# Patient Record
Sex: Male | Born: 1995 | Race: White | Hispanic: No | Marital: Single | Smoking: Never smoker
Health system: Southern US, Community
[De-identification: ages and names within clinical notes are randomized; demographics above are authoritative.]

## PROBLEM LIST (undated history)

## (undated) ENCOUNTER — Emergency Department (HOSPITAL_COMMUNITY): Admission: EM | Payer: Self-pay | Source: Home / Self Care

## (undated) DIAGNOSIS — K8020 Calculus of gallbladder without cholecystitis without obstruction: Secondary | ICD-10-CM

## (undated) DIAGNOSIS — Z5189 Encounter for other specified aftercare: Secondary | ICD-10-CM

## (undated) HISTORY — DX: Encounter for other specified aftercare: Z51.89

## (undated) HISTORY — PX: COLOSTOMY: SHX63

---

## 2016-12-12 DIAGNOSIS — E559 Vitamin D deficiency, unspecified: Secondary | ICD-10-CM | POA: Diagnosis not present

## 2016-12-12 DIAGNOSIS — E1165 Type 2 diabetes mellitus with hyperglycemia: Secondary | ICD-10-CM | POA: Diagnosis not present

## 2016-12-12 DIAGNOSIS — I10 Essential (primary) hypertension: Secondary | ICD-10-CM | POA: Diagnosis not present

## 2016-12-12 DIAGNOSIS — Z130 Encounter for screening for diseases of the blood and blood-forming organs and certain disorders involving the immune mechanism: Secondary | ICD-10-CM | POA: Diagnosis not present

## 2016-12-12 DIAGNOSIS — E782 Mixed hyperlipidemia: Secondary | ICD-10-CM | POA: Diagnosis not present

## 2016-12-12 DIAGNOSIS — L600 Ingrowing nail: Secondary | ICD-10-CM | POA: Diagnosis not present

## 2016-12-12 DIAGNOSIS — E039 Hypothyroidism, unspecified: Secondary | ICD-10-CM | POA: Diagnosis not present

## 2016-12-12 DIAGNOSIS — R37 Sexual dysfunction, unspecified: Secondary | ICD-10-CM | POA: Diagnosis not present

## 2016-12-12 DIAGNOSIS — E790 Hyperuricemia without signs of inflammatory arthritis and tophaceous disease: Secondary | ICD-10-CM | POA: Diagnosis not present

## 2016-12-12 DIAGNOSIS — M79641 Pain in right hand: Secondary | ICD-10-CM | POA: Diagnosis not present

## 2017-01-18 DIAGNOSIS — N509 Disorder of male genital organs, unspecified: Secondary | ICD-10-CM | POA: Diagnosis not present

## 2017-01-18 DIAGNOSIS — F5232 Male orgasmic disorder: Secondary | ICD-10-CM | POA: Diagnosis not present

## 2017-05-10 DIAGNOSIS — Z3141 Encounter for fertility testing: Secondary | ICD-10-CM | POA: Diagnosis not present

## 2017-11-02 DIAGNOSIS — S60551A Superficial foreign body of right hand, initial encounter: Secondary | ICD-10-CM | POA: Diagnosis not present

## 2017-11-30 DIAGNOSIS — M25551 Pain in right hip: Secondary | ICD-10-CM | POA: Diagnosis not present

## 2017-11-30 DIAGNOSIS — Z6827 Body mass index (BMI) 27.0-27.9, adult: Secondary | ICD-10-CM | POA: Diagnosis not present

## 2017-11-30 DIAGNOSIS — Z1331 Encounter for screening for depression: Secondary | ICD-10-CM | POA: Diagnosis not present

## 2017-11-30 DIAGNOSIS — E663 Overweight: Secondary | ICD-10-CM | POA: Diagnosis not present

## 2018-01-19 DIAGNOSIS — H5203 Hypermetropia, bilateral: Secondary | ICD-10-CM | POA: Diagnosis not present

## 2018-02-28 ENCOUNTER — Inpatient Hospital Stay (HOSPITAL_COMMUNITY): Payer: BLUE CROSS/BLUE SHIELD

## 2018-02-28 ENCOUNTER — Encounter (HOSPITAL_COMMUNITY): Payer: Self-pay | Admitting: Certified Registered"

## 2018-02-28 ENCOUNTER — Emergency Department (HOSPITAL_COMMUNITY): Payer: BLUE CROSS/BLUE SHIELD

## 2018-02-28 ENCOUNTER — Inpatient Hospital Stay (HOSPITAL_COMMUNITY): Payer: BLUE CROSS/BLUE SHIELD | Admitting: Anesthesiology

## 2018-02-28 ENCOUNTER — Other Ambulatory Visit: Payer: Self-pay

## 2018-02-28 ENCOUNTER — Inpatient Hospital Stay (HOSPITAL_COMMUNITY)
Admission: EM | Admit: 2018-02-28 | Discharge: 2018-03-31 | DRG: 956 | Disposition: A | Discharge status: Home Health | Payer: BLUE CROSS/BLUE SHIELD | Attending: General Surgery | Admitting: General Surgery

## 2018-02-28 ENCOUNTER — Encounter (HOSPITAL_COMMUNITY): Admission: EM | Disposition: A | Discharge status: Home Health | Payer: Self-pay | Source: Home / Self Care

## 2018-02-28 DIAGNOSIS — K631 Perforation of intestine (nontraumatic): Secondary | ICD-10-CM

## 2018-02-28 DIAGNOSIS — S36899A Unspecified injury of other intra-abdominal organs, initial encounter: Secondary | ICD-10-CM | POA: Diagnosis present

## 2018-02-28 DIAGNOSIS — S72301A Unspecified fracture of shaft of right femur, initial encounter for closed fracture: Secondary | ICD-10-CM | POA: Diagnosis not present

## 2018-02-28 DIAGNOSIS — S36538A Laceration of other part of colon, initial encounter: Principal | ICD-10-CM | POA: Diagnosis present

## 2018-02-28 DIAGNOSIS — S36508A Unspecified injury of other part of colon, initial encounter: Secondary | ICD-10-CM

## 2018-02-28 DIAGNOSIS — S0081XA Abrasion of other part of head, initial encounter: Secondary | ICD-10-CM | POA: Diagnosis present

## 2018-02-28 DIAGNOSIS — N1330 Unspecified hydronephrosis: Secondary | ICD-10-CM | POA: Diagnosis not present

## 2018-02-28 DIAGNOSIS — Z0189 Encounter for other specified special examinations: Secondary | ICD-10-CM

## 2018-02-28 DIAGNOSIS — T148XXA Other injury of unspecified body region, initial encounter: Secondary | ICD-10-CM | POA: Diagnosis present

## 2018-02-28 DIAGNOSIS — R509 Fever, unspecified: Secondary | ICD-10-CM

## 2018-02-28 DIAGNOSIS — R739 Hyperglycemia, unspecified: Secondary | ICD-10-CM | POA: Diagnosis present

## 2018-02-28 DIAGNOSIS — J939 Pneumothorax, unspecified: Secondary | ICD-10-CM | POA: Diagnosis not present

## 2018-02-28 DIAGNOSIS — Z419 Encounter for procedure for purposes other than remedying health state, unspecified: Secondary | ICD-10-CM

## 2018-02-28 DIAGNOSIS — R188 Other ascites: Secondary | ICD-10-CM | POA: Diagnosis not present

## 2018-02-28 DIAGNOSIS — S9031XA Contusion of right foot, initial encounter: Secondary | ICD-10-CM | POA: Diagnosis present

## 2018-02-28 DIAGNOSIS — K56699 Other intestinal obstruction unspecified as to partial versus complete obstruction: Secondary | ICD-10-CM | POA: Diagnosis not present

## 2018-02-28 DIAGNOSIS — R0902 Hypoxemia: Secondary | ICD-10-CM | POA: Diagnosis not present

## 2018-02-28 DIAGNOSIS — S36409A Unspecified injury of unspecified part of small intestine, initial encounter: Secondary | ICD-10-CM | POA: Diagnosis present

## 2018-02-28 DIAGNOSIS — I959 Hypotension, unspecified: Secondary | ICD-10-CM | POA: Diagnosis present

## 2018-02-28 DIAGNOSIS — K55029 Acute infarction of small intestine, extent unspecified: Secondary | ICD-10-CM | POA: Diagnosis not present

## 2018-02-28 DIAGNOSIS — S72143D Displaced intertrochanteric fracture of unspecified femur, subsequent encounter for closed fracture with routine healing: Secondary | ICD-10-CM | POA: Diagnosis not present

## 2018-02-28 DIAGNOSIS — R402363 Coma scale, best motor response, obeys commands, at hospital admission: Secondary | ICD-10-CM | POA: Diagnosis present

## 2018-02-28 DIAGNOSIS — S36503A Unspecified injury of sigmoid colon, initial encounter: Secondary | ICD-10-CM | POA: Diagnosis present

## 2018-02-28 DIAGNOSIS — R000 Tachycardia, unspecified: Secondary | ICD-10-CM | POA: Diagnosis present

## 2018-02-28 DIAGNOSIS — S36598A Other injury of other part of colon, initial encounter: Secondary | ICD-10-CM | POA: Diagnosis present

## 2018-02-28 DIAGNOSIS — Z4682 Encounter for fitting and adjustment of non-vascular catheter: Secondary | ICD-10-CM | POA: Diagnosis not present

## 2018-02-28 DIAGNOSIS — J9600 Acute respiratory failure, unspecified whether with hypoxia or hypercapnia: Secondary | ICD-10-CM | POA: Diagnosis not present

## 2018-02-28 DIAGNOSIS — S36893A Laceration of other intra-abdominal organs, initial encounter: Secondary | ICD-10-CM

## 2018-02-28 DIAGNOSIS — S7291XA Unspecified fracture of right femur, initial encounter for closed fracture: Secondary | ICD-10-CM

## 2018-02-28 DIAGNOSIS — J9811 Atelectasis: Secondary | ICD-10-CM | POA: Diagnosis not present

## 2018-02-28 DIAGNOSIS — R404 Transient alteration of awareness: Secondary | ICD-10-CM | POA: Diagnosis not present

## 2018-02-28 DIAGNOSIS — S728X1A Other fracture of right femur, initial encounter for closed fracture: Secondary | ICD-10-CM | POA: Diagnosis not present

## 2018-02-28 DIAGNOSIS — S3719XA Other injury of ureter, initial encounter: Secondary | ICD-10-CM | POA: Diagnosis not present

## 2018-02-28 DIAGNOSIS — S37011A Minor contusion of right kidney, initial encounter: Secondary | ICD-10-CM | POA: Diagnosis present

## 2018-02-28 DIAGNOSIS — R52 Pain, unspecified: Secondary | ICD-10-CM | POA: Diagnosis not present

## 2018-02-28 DIAGNOSIS — S3993XA Unspecified injury of pelvis, initial encounter: Secondary | ICD-10-CM | POA: Diagnosis not present

## 2018-02-28 DIAGNOSIS — Z933 Colostomy status: Secondary | ICD-10-CM

## 2018-02-28 DIAGNOSIS — R0689 Other abnormalities of breathing: Secondary | ICD-10-CM | POA: Diagnosis not present

## 2018-02-28 DIAGNOSIS — B952 Enterococcus as the cause of diseases classified elsewhere: Secondary | ICD-10-CM | POA: Diagnosis present

## 2018-02-28 DIAGNOSIS — K388 Other specified diseases of appendix: Secondary | ICD-10-CM | POA: Diagnosis not present

## 2018-02-28 DIAGNOSIS — S199XXA Unspecified injury of neck, initial encounter: Secondary | ICD-10-CM | POA: Diagnosis not present

## 2018-02-28 DIAGNOSIS — K56609 Unspecified intestinal obstruction, unspecified as to partial versus complete obstruction: Secondary | ICD-10-CM | POA: Diagnosis not present

## 2018-02-28 DIAGNOSIS — S3710XA Unspecified injury of ureter, initial encounter: Secondary | ICD-10-CM | POA: Diagnosis not present

## 2018-02-28 DIAGNOSIS — E875 Hyperkalemia: Secondary | ICD-10-CM | POA: Diagnosis not present

## 2018-02-28 DIAGNOSIS — S3991XA Unspecified injury of abdomen, initial encounter: Secondary | ICD-10-CM | POA: Diagnosis not present

## 2018-02-28 DIAGNOSIS — K658 Other peritonitis: Secondary | ICD-10-CM | POA: Diagnosis not present

## 2018-02-28 DIAGNOSIS — Y92410 Unspecified street and highway as the place of occurrence of the external cause: Secondary | ICD-10-CM | POA: Diagnosis not present

## 2018-02-28 DIAGNOSIS — S7290XA Unspecified fracture of unspecified femur, initial encounter for closed fracture: Secondary | ICD-10-CM

## 2018-02-28 DIAGNOSIS — K651 Peritoneal abscess: Secondary | ICD-10-CM | POA: Diagnosis not present

## 2018-02-28 DIAGNOSIS — M79671 Pain in right foot: Secondary | ICD-10-CM

## 2018-02-28 DIAGNOSIS — K565 Intestinal adhesions [bands], unspecified as to partial versus complete obstruction: Secondary | ICD-10-CM | POA: Diagnosis present

## 2018-02-28 DIAGNOSIS — S36590A Other injury of ascending [right] colon, initial encounter: Secondary | ICD-10-CM | POA: Diagnosis not present

## 2018-02-28 DIAGNOSIS — S41111A Laceration without foreign body of right upper arm, initial encounter: Secondary | ICD-10-CM | POA: Diagnosis present

## 2018-02-28 DIAGNOSIS — T503X5A Adverse effect of electrolytic, caloric and water-balance agents, initial encounter: Secondary | ICD-10-CM | POA: Diagnosis not present

## 2018-02-28 DIAGNOSIS — K567 Ileus, unspecified: Secondary | ICD-10-CM | POA: Diagnosis not present

## 2018-02-28 DIAGNOSIS — S72391A Other fracture of shaft of right femur, initial encounter for closed fracture: Secondary | ICD-10-CM | POA: Diagnosis not present

## 2018-02-28 DIAGNOSIS — S36498A Other injury of other part of small intestine, initial encounter: Secondary | ICD-10-CM | POA: Diagnosis not present

## 2018-02-28 DIAGNOSIS — R402143 Coma scale, eyes open, spontaneous, at hospital admission: Secondary | ICD-10-CM | POA: Diagnosis present

## 2018-02-28 DIAGNOSIS — E46 Unspecified protein-calorie malnutrition: Secondary | ICD-10-CM | POA: Diagnosis not present

## 2018-02-28 DIAGNOSIS — S0181XA Laceration without foreign body of other part of head, initial encounter: Secondary | ICD-10-CM | POA: Diagnosis not present

## 2018-02-28 DIAGNOSIS — T07XXXA Unspecified multiple injuries, initial encounter: Secondary | ICD-10-CM | POA: Diagnosis not present

## 2018-02-28 DIAGNOSIS — T1490XA Injury, unspecified, initial encounter: Secondary | ICD-10-CM

## 2018-02-28 DIAGNOSIS — R079 Chest pain, unspecified: Secondary | ICD-10-CM | POA: Diagnosis not present

## 2018-02-28 DIAGNOSIS — E876 Hypokalemia: Secondary | ICD-10-CM | POA: Diagnosis not present

## 2018-02-28 DIAGNOSIS — S72351A Displaced comminuted fracture of shaft of right femur, initial encounter for closed fracture: Secondary | ICD-10-CM | POA: Diagnosis not present

## 2018-02-28 DIAGNOSIS — D62 Acute posthemorrhagic anemia: Secondary | ICD-10-CM | POA: Diagnosis not present

## 2018-02-28 DIAGNOSIS — N281 Cyst of kidney, acquired: Secondary | ICD-10-CM | POA: Diagnosis not present

## 2018-02-28 DIAGNOSIS — K659 Peritonitis, unspecified: Secondary | ICD-10-CM | POA: Diagnosis not present

## 2018-02-28 DIAGNOSIS — S2242XA Multiple fractures of ribs, left side, initial encounter for closed fracture: Secondary | ICD-10-CM | POA: Diagnosis not present

## 2018-02-28 DIAGNOSIS — Z978 Presence of other specified devices: Secondary | ICD-10-CM

## 2018-02-28 DIAGNOSIS — Z9889 Other specified postprocedural states: Secondary | ICD-10-CM

## 2018-02-28 DIAGNOSIS — R402253 Coma scale, best verbal response, oriented, at hospital admission: Secondary | ICD-10-CM | POA: Diagnosis present

## 2018-02-28 DIAGNOSIS — S0990XA Unspecified injury of head, initial encounter: Secondary | ICD-10-CM | POA: Diagnosis not present

## 2018-02-28 DIAGNOSIS — T8143XA Infection following a procedure, organ and space surgical site, initial encounter: Secondary | ICD-10-CM | POA: Diagnosis not present

## 2018-02-28 DIAGNOSIS — R5381 Other malaise: Secondary | ICD-10-CM | POA: Diagnosis not present

## 2018-02-28 DIAGNOSIS — R7989 Other specified abnormal findings of blood chemistry: Secondary | ICD-10-CM

## 2018-02-28 DIAGNOSIS — S299XXA Unspecified injury of thorax, initial encounter: Secondary | ICD-10-CM | POA: Diagnosis not present

## 2018-02-28 DIAGNOSIS — R7981 Abnormal blood-gas level: Secondary | ICD-10-CM

## 2018-02-28 HISTORY — PX: APPLICATION OF WOUND VAC: SHX5189

## 2018-02-28 HISTORY — PX: BOWEL RESECTION: SHX1257

## 2018-02-28 HISTORY — PX: LAPAROTOMY: SHX154

## 2018-02-28 HISTORY — PX: EXTERNAL FIXATION LEG: SHX1549

## 2018-02-28 LAB — I-STAT CG4 LACTIC ACID, ED: Lactic Acid, Venous: 3.4 mmol/L (ref 0.5–1.9)

## 2018-02-28 LAB — PREPARE FRESH FROZEN PLASMA
UNIT DIVISION: 0
UNIT DIVISION: 0
Unit division: 0

## 2018-02-28 LAB — BPAM FFP
BLOOD PRODUCT EXPIRATION DATE: 201910312359
BLOOD PRODUCT EXPIRATION DATE: 201910312359
Blood Product Expiration Date: 201910312359
Blood Product Expiration Date: 201910312359
ISSUE DATE / TIME: 201910300826
ISSUE DATE / TIME: 201910301922
ISSUE DATE / TIME: 201910300826
ISSUE DATE / TIME: 201910301922
UNIT TYPE AND RH: 6200
UNIT TYPE AND RH: 6200
UNIT TYPE AND RH: 6200
Unit Type and Rh: 6200

## 2018-02-28 LAB — I-STAT CHEM 8, ED
BUN: 20 mg/dL (ref 6–20)
CHLORIDE: 106 mmol/L (ref 98–111)
CREATININE: 1.3 mg/dL — AB (ref 0.61–1.24)
Calcium, Ion: 1.02 mmol/L — ABNORMAL LOW (ref 1.15–1.40)
GLUCOSE: 147 mg/dL — AB (ref 70–99)
HCT: 40 % (ref 39.0–52.0)
Hemoglobin: 13.6 g/dL (ref 13.0–17.0)
POTASSIUM: 2.6 mmol/L — AB (ref 3.5–5.1)
Sodium: 143 mmol/L (ref 135–145)
TCO2: 23 mmol/L (ref 22–32)

## 2018-02-28 LAB — MRSA PCR SCREENING: MRSA by PCR: NEGATIVE

## 2018-02-28 LAB — CBC
HCT: 44.5 % (ref 39.0–52.0)
HCT: 39.2 % (ref 39.0–52.0)
HEMOGLOBIN: 12.5 g/dL — AB (ref 13.0–17.0)
Hemoglobin: 14.1 g/dL (ref 13.0–17.0)
MCH: 27.7 pg (ref 26.0–34.0)
MCH: 27.5 pg (ref 26.0–34.0)
MCHC: 31.7 g/dL (ref 30.0–36.0)
MCHC: 31.9 g/dL (ref 30.0–36.0)
MCV: 87.4 fL (ref 80.0–100.0)
MCV: 86.2 fL (ref 80.0–100.0)
NRBC: 0 % (ref 0.0–0.2)
PLATELETS: 378 10*3/uL (ref 150–400)
PLATELETS: 286 10*3/uL (ref 150–400)
RBC: 5.09 MIL/uL (ref 4.22–5.81)
RBC: 4.55 MIL/uL (ref 4.22–5.81)
RDW: 12.3 % (ref 11.5–15.5)
RDW: 12.5 % (ref 11.5–15.5)
WBC: 21.6 10*3/uL — ABNORMAL HIGH (ref 4.0–10.5)
WBC: 17.3 10*3/uL — AB (ref 4.0–10.5)
nRBC: 0 % (ref 0.0–0.2)

## 2018-02-28 LAB — COMPREHENSIVE METABOLIC PANEL
ALK PHOS: 54 U/L (ref 38–126)
ALT: 50 U/L — AB (ref 0–44)
AST: 64 U/L — ABNORMAL HIGH (ref 15–41)
Albumin: 3.2 g/dL — ABNORMAL LOW (ref 3.5–5.0)
Anion gap: 8 (ref 5–15)
BILIRUBIN TOTAL: 0.7 mg/dL (ref 0.3–1.2)
BUN: 18 mg/dL (ref 6–20)
CALCIUM: 7.7 mg/dL — AB (ref 8.9–10.3)
CO2: 23 mmol/L (ref 22–32)
CREATININE: 1.25 mg/dL — AB (ref 0.61–1.24)
Chloride: 111 mmol/L (ref 98–111)
GFR calc Af Amer: >60 mL/min (ref >60)
GFR calc non Af Amer: >60 mL/min (ref >60)
Glucose, Bld: 153 mg/dL — ABNORMAL HIGH (ref 70–99)
Potassium: 2.6 mmol/L — CL (ref 3.5–5.1)
Sodium: 142 mmol/L (ref 135–145)
TOTAL PROTEIN: 5.7 g/dL — AB (ref 6.5–8.1)

## 2018-02-28 LAB — CDS SEROLOGY

## 2018-02-28 LAB — PROTIME-INR
INR: 1.13
PROTHROMBIN TIME: 14.4 s (ref 11.4–15.2)

## 2018-02-28 LAB — ETHANOL

## 2018-02-28 LAB — ABO/RH: ABO/RH(D): A POS

## 2018-02-28 SURGERY — LAPAROTOMY, EXPLORATORY
Anesthesia: General | Site: Leg Upper | Laterality: Right

## 2018-02-28 MED ORDER — PHENYLEPHRINE 40 MCG/ML (10ML) SYRINGE FOR IV PUSH (FOR BLOOD PRESSURE SUPPORT)
PREFILLED_SYRINGE | INTRAVENOUS | Status: AC
  Filled 2018-02-28: qty 10

## 2018-02-28 MED ORDER — FENTANYL CITRATE (PF) 250 MCG/5ML IJ SOLN
INTRAMUSCULAR | Status: DC | PRN
  Administered 2018-02-28 (×4): 50 ug via INTRAVENOUS
  Administered 2018-02-28: 25 ug via INTRAVENOUS
  Administered 2018-02-28 (×2): 50 ug via INTRAVENOUS

## 2018-02-28 MED ORDER — CHLORHEXIDINE GLUCONATE 4 % EX LIQD
60.0000 mL | Freq: Once | CUTANEOUS | Status: DC
  Filled 2018-02-28: qty 60

## 2018-02-28 MED ORDER — LIDOCAINE 2% (20 MG/ML) 5 ML SYRINGE
INTRAMUSCULAR | Status: DC | PRN
  Administered 2018-02-28: 100 mg via INTRAVENOUS

## 2018-02-28 MED ORDER — ALBUMIN HUMAN 5 % IV SOLN
INTRAVENOUS | Status: DC | PRN
  Administered 2018-02-28 (×2): via INTRAVENOUS

## 2018-02-28 MED ORDER — SODIUM CHLORIDE 0.9 % IV BOLUS
500.0000 mL | Freq: Once | INTRAVENOUS | Status: AC
  Administered 2018-02-28: 500 mL via INTRAVENOUS

## 2018-02-28 MED ORDER — SUGAMMADEX SODIUM 200 MG/2ML IV SOLN
INTRAVENOUS | Status: DC | PRN
  Administered 2018-02-28: 200 mg via INTRAVENOUS

## 2018-02-28 MED ORDER — LIDOCAINE 2% (20 MG/ML) 5 ML SYRINGE
INTRAMUSCULAR | Status: AC
  Filled 2018-02-28: qty 5

## 2018-02-28 MED ORDER — FENTANYL CITRATE (PF) 100 MCG/2ML IJ SOLN
100.0000 ug | INTRAMUSCULAR | Status: DC | PRN
  Administered 2018-02-28: 100 ug via INTRAVENOUS
  Filled 2018-02-28: qty 2

## 2018-02-28 MED ORDER — METOPROLOL TARTRATE 5 MG/5ML IV SOLN: 5.0000 mg | Freq: Four times a day (QID) | INTRAVENOUS | Status: DC | PRN

## 2018-02-28 MED ORDER — MIDAZOLAM HCL 2 MG/2ML IJ SOLN
INTRAMUSCULAR | Status: AC
  Filled 2018-02-28: qty 2

## 2018-02-28 MED ORDER — METHOCARBAMOL 1000 MG/10ML IJ SOLN
500.0000 mg | Freq: Three times a day (TID) | INTRAVENOUS | Status: DC
  Administered 2018-02-28 – 2018-03-05 (×15): 500 mg via INTRAVENOUS
  Filled 2018-02-28 (×16): qty 5

## 2018-02-28 MED ORDER — MIDAZOLAM HCL 5 MG/5ML IJ SOLN
INTRAMUSCULAR | Status: DC | PRN
  Administered 2018-02-28: 2 mg via INTRAVENOUS

## 2018-02-28 MED ORDER — ALBUMIN HUMAN 5 % IV SOLN
12.5000 g | Freq: Once | INTRAVENOUS | Status: AC
  Administered 2018-02-28: 12.5 g via INTRAVENOUS
  Filled 2018-02-28: qty 500

## 2018-02-28 MED ORDER — HYDROMORPHONE 1 MG/ML IV SOLN
INTRAVENOUS | Status: DC
  Administered 2018-02-28: 17:00:00 via INTRAVENOUS
  Filled 2018-02-28 (×2): qty 25

## 2018-02-28 MED ORDER — ONDANSETRON HCL 4 MG/2ML IJ SOLN: 4.0000 mg | Freq: Four times a day (QID) | INTRAMUSCULAR | Status: DC | PRN

## 2018-02-28 MED ORDER — NALOXONE HCL 0.4 MG/ML IJ SOLN
INTRAMUSCULAR | Status: AC
  Administered 2018-02-28: 0.4 mg via INTRAVENOUS
  Filled 2018-02-28: qty 1

## 2018-02-28 MED ORDER — SODIUM CHLORIDE 0.9 % IV SOLN
INTRAVENOUS | Status: AC | PRN
  Administered 2018-02-28: 1000 mL via INTRAVENOUS

## 2018-02-28 MED ORDER — DEXAMETHASONE SODIUM PHOSPHATE 10 MG/ML IJ SOLN
INTRAMUSCULAR | Status: AC
  Filled 2018-02-28: qty 1

## 2018-02-28 MED ORDER — POVIDONE-IODINE 10 % EX SWAB: 2.0000 "application " | Freq: Once | CUTANEOUS | Status: DC

## 2018-02-28 MED ORDER — ONDANSETRON HCL 4 MG/2ML IJ SOLN
INTRAMUSCULAR | Status: DC | PRN
  Administered 2018-02-28: 4 mg via INTRAVENOUS

## 2018-02-28 MED ORDER — EPHEDRINE 5 MG/ML INJ
INTRAVENOUS | Status: AC
  Filled 2018-02-28: qty 10

## 2018-02-28 MED ORDER — CEFAZOLIN SODIUM-DEXTROSE 2-4 GM/100ML-% IV SOLN: 2.0000 g | INTRAVENOUS | Status: DC

## 2018-02-28 MED ORDER — FENTANYL CITRATE (PF) 250 MCG/5ML IJ SOLN
INTRAMUSCULAR | Status: AC
  Filled 2018-02-28: qty 5

## 2018-02-28 MED ORDER — FENTANYL CITRATE (PF) 100 MCG/2ML IJ SOLN
INTRAMUSCULAR | Status: AC | PRN
  Administered 2018-02-28: 100 ug via INTRAVENOUS

## 2018-02-28 MED ORDER — SODIUM CHLORIDE 0.9 % IV SOLN
2.0000 g | Freq: Two times a day (BID) | INTRAVENOUS | Status: DC
  Filled 2018-02-28 (×3): qty 2

## 2018-02-28 MED ORDER — GLYCOPYRROLATE PF 0.2 MG/ML IJ SOSY
PREFILLED_SYRINGE | INTRAMUSCULAR | Status: AC
  Filled 2018-02-28: qty 2

## 2018-02-28 MED ORDER — SODIUM CHLORIDE 0.9 % IV SOLN
2.0000 g | Freq: Two times a day (BID) | INTRAVENOUS | Status: DC
  Filled 2018-02-28: qty 2

## 2018-02-28 MED ORDER — FENTANYL CITRATE (PF) 100 MCG/2ML IJ SOLN
50.0000 ug | INTRAMUSCULAR | Status: DC | PRN
  Administered 2018-02-28: 100 ug via INTRAVENOUS
  Administered 2018-03-01 (×3): 50 ug via INTRAVENOUS
  Administered 2018-03-02 – 2018-03-04 (×2): 100 ug via INTRAVENOUS
  Administered 2018-03-04: 50 ug via INTRAVENOUS
  Administered 2018-03-04 – 2018-03-05 (×8): 100 ug via INTRAVENOUS
  Administered 2018-03-05: 50 ug via INTRAVENOUS
  Administered 2018-03-05 – 2018-03-07 (×12): 100 ug via INTRAVENOUS
  Filled 2018-02-28 (×29): qty 2

## 2018-02-28 MED ORDER — PHENYLEPHRINE 40 MCG/ML (10ML) SYRINGE FOR IV PUSH (FOR BLOOD PRESSURE SUPPORT)
PREFILLED_SYRINGE | INTRAVENOUS | Status: DC | PRN
  Administered 2018-02-28: 80 ug via INTRAVENOUS
  Administered 2018-02-28: 120 ug via INTRAVENOUS

## 2018-02-28 MED ORDER — FENTANYL CITRATE (PF) 100 MCG/2ML IJ SOLN: 25.0000 ug | INTRAMUSCULAR | Status: DC | PRN

## 2018-02-28 MED ORDER — POTASSIUM CHLORIDE 10 MEQ/100ML IV SOLN
10.0000 meq | INTRAVENOUS | Status: DC
  Administered 2018-02-28 (×3): 10 meq via INTRAVENOUS
  Filled 2018-02-28: qty 100

## 2018-02-28 MED ORDER — IOHEXOL 300 MG/ML  SOLN
100.0000 mL | Freq: Once | INTRAMUSCULAR | Status: AC
  Administered 2018-02-28: 100 mL via INTRAVENOUS

## 2018-02-28 MED ORDER — PROPOFOL 10 MG/ML IV BOLUS
INTRAVENOUS | Status: AC
  Filled 2018-02-28: qty 20

## 2018-02-28 MED ORDER — PROPOFOL 10 MG/ML IV BOLUS
INTRAVENOUS | Status: DC | PRN
  Administered 2018-02-28: 30 mg via INTRAVENOUS
  Administered 2018-02-28: 150 mg via INTRAVENOUS

## 2018-02-28 MED ORDER — ACETAMINOPHEN 10 MG/ML IV SOLN
1000.0000 mg | Freq: Four times a day (QID) | INTRAVENOUS | Status: AC
  Administered 2018-02-28 – 2018-03-01 (×3): 1000 mg via INTRAVENOUS
  Filled 2018-02-28 (×4): qty 100

## 2018-02-28 MED ORDER — LACTATED RINGERS IV SOLN: INTRAVENOUS | Status: DC

## 2018-02-28 MED ORDER — DEXMEDETOMIDINE HCL IN NACL 200 MCG/50ML IV SOLN
INTRAVENOUS | Status: DC | PRN
  Administered 2018-02-28 (×2): 8 ug via INTRAVENOUS

## 2018-02-28 MED ORDER — LACTATED RINGERS IV SOLN
INTRAVENOUS | Status: DC | PRN
  Administered 2018-02-28: 09:00:00 via INTRAVENOUS

## 2018-02-28 MED ORDER — ONDANSETRON HCL 4 MG/2ML IJ SOLN
4.0000 mg | Freq: Four times a day (QID) | INTRAMUSCULAR | Status: DC | PRN
  Administered 2018-03-05 – 2018-03-07 (×5): 4 mg via INTRAVENOUS
  Filled 2018-02-28 (×6): qty 2

## 2018-02-28 MED ORDER — FAMOTIDINE IN NACL 20-0.9 MG/50ML-% IV SOLN
20.0000 mg | INTRAVENOUS | Status: DC
  Administered 2018-03-01 – 2018-03-05 (×5): 20 mg via INTRAVENOUS
  Filled 2018-02-28 (×6): qty 50

## 2018-02-28 MED ORDER — SODIUM CHLORIDE 0.9 % IV SOLN
2.0000 g | Freq: Once | INTRAVENOUS | Status: AC
  Administered 2018-02-28: 09:00:00 via INTRAVENOUS
  Filled 2018-02-28: qty 2

## 2018-02-28 MED ORDER — ONDANSETRON HCL 4 MG/2ML IJ SOLN
INTRAMUSCULAR | Status: AC
  Filled 2018-02-28: qty 2

## 2018-02-28 MED ORDER — DEXAMETHASONE SODIUM PHOSPHATE 10 MG/ML IJ SOLN
INTRAMUSCULAR | Status: DC | PRN
  Administered 2018-02-28: 10 mg via INTRAVENOUS

## 2018-02-28 MED ORDER — ALBUMIN HUMAN 5 % IV SOLN
12.5000 g | Freq: Once | INTRAVENOUS | Status: AC
  Administered 2018-02-28: 12.5 g via INTRAVENOUS

## 2018-02-28 MED ORDER — DIPHENHYDRAMINE HCL 50 MG/ML IJ SOLN: 12.5000 mg | Freq: Four times a day (QID) | INTRAMUSCULAR | Status: DC | PRN

## 2018-02-28 MED ORDER — DIPHENHYDRAMINE HCL 12.5 MG/5ML PO ELIX: 12.5000 mg | ORAL_SOLUTION | Freq: Four times a day (QID) | ORAL | Status: DC | PRN

## 2018-02-28 MED ORDER — CEFOTETAN DISODIUM-DEXTROSE 2-2.08 GM-%(50ML) IV SOLR
2.0000 g | INTRAVENOUS | Status: DC
  Filled 2018-02-28: qty 50

## 2018-02-28 MED ORDER — CEFOTETAN DISODIUM-DEXTROSE 2-2.08 GM-%(50ML) IV SOLR
2.0000 g | Freq: Two times a day (BID) | INTRAVENOUS | Status: AC
  Administered 2018-02-28 – 2018-03-03 (×5): 2 g via INTRAVENOUS
  Filled 2018-02-28 (×10): qty 50

## 2018-02-28 MED ORDER — SODIUM CHLORIDE 0.9% FLUSH: 9.0000 mL | INTRAVENOUS | Status: DC | PRN

## 2018-02-28 MED ORDER — ROCURONIUM BROMIDE 10 MG/ML (PF) SYRINGE
PREFILLED_SYRINGE | INTRAVENOUS | Status: DC | PRN
  Administered 2018-02-28: 20 mg via INTRAVENOUS
  Administered 2018-02-28: 50 mg via INTRAVENOUS
  Administered 2018-02-28: 20 mg via INTRAVENOUS
  Administered 2018-02-28: 10 mg via INTRAVENOUS
  Administered 2018-02-28: 20 mg via INTRAVENOUS

## 2018-02-28 MED ORDER — PROPOFOL 1000 MG/100ML IV EMUL
INTRAVENOUS | Status: AC
  Filled 2018-02-28: qty 100

## 2018-02-28 MED ORDER — PANTOPRAZOLE SODIUM 40 MG PO TBEC
40.0000 mg | DELAYED_RELEASE_TABLET | ORAL | Status: DC
  Administered 2018-03-06 – 2018-03-07 (×2): 40 mg via ORAL
  Filled 2018-02-28 (×2): qty 1

## 2018-02-28 MED ORDER — ROCURONIUM BROMIDE 50 MG/5ML IV SOSY
PREFILLED_SYRINGE | INTRAVENOUS | Status: AC
  Filled 2018-02-28: qty 10

## 2018-02-28 MED ORDER — NALOXONE HCL 0.4 MG/ML IJ SOLN
0.4000 mg | INTRAMUSCULAR | Status: DC | PRN
  Administered 2018-02-28: 0.4 mg via INTRAVENOUS

## 2018-02-28 MED ORDER — SUCCINYLCHOLINE CHLORIDE 200 MG/10ML IV SOSY
PREFILLED_SYRINGE | INTRAVENOUS | Status: DC | PRN
  Administered 2018-02-28: 100 mg via INTRAVENOUS

## 2018-02-28 MED ORDER — KETOROLAC TROMETHAMINE 30 MG/ML IJ SOLN
INTRAMUSCULAR | Status: DC | PRN
  Administered 2018-02-28: 30 mg via INTRAVENOUS

## 2018-02-28 MED ORDER — ONDANSETRON 4 MG PO TBDP
4.0000 mg | ORAL_TABLET | Freq: Four times a day (QID) | ORAL | Status: DC | PRN
  Filled 2018-02-28: qty 1

## 2018-02-28 MED ORDER — SUCCINYLCHOLINE CHLORIDE 200 MG/10ML IV SOSY
PREFILLED_SYRINGE | INTRAVENOUS | Status: AC
  Filled 2018-02-28: qty 10

## 2018-02-28 MED ORDER — FENTANYL CITRATE (PF) 100 MCG/2ML IJ SOLN
INTRAMUSCULAR | Status: AC
  Filled 2018-02-28: qty 2

## 2018-02-28 MED ORDER — FENTANYL CITRATE (PF) 100 MCG/2ML IJ SOLN
50.0000 ug | INTRAMUSCULAR | Status: DC | PRN
  Administered 2018-02-28 (×2): 50 ug via INTRAVENOUS
  Filled 2018-02-28 (×2): qty 2

## 2018-02-28 MED ORDER — METOCLOPRAMIDE HCL 5 MG/ML IJ SOLN: 10.0000 mg | Freq: Once | INTRAMUSCULAR | Status: DC | PRN

## 2018-02-28 MED ORDER — INFLUENZA VAC SPLIT QUAD 0.5 ML IM SUSY: 0.5000 mL | PREFILLED_SYRINGE | INTRAMUSCULAR | Status: DC

## 2018-02-28 MED ORDER — KCL IN DEXTROSE-NACL 40-5-0.45 MEQ/L-%-% IV SOLN
INTRAVENOUS | Status: DC
  Administered 2018-02-28 – 2018-03-05 (×9): via INTRAVENOUS
  Filled 2018-02-28 (×13): qty 1000

## 2018-02-28 MED ORDER — 0.9 % SODIUM CHLORIDE (POUR BTL) OPTIME
TOPICAL | Status: DC | PRN
  Administered 2018-02-28 (×5): 1000 mL

## 2018-02-28 MED ORDER — ROCURONIUM BROMIDE 50 MG/5ML IV SOSY
PREFILLED_SYRINGE | INTRAVENOUS | Status: AC
  Filled 2018-02-28: qty 5

## 2018-02-28 MED ORDER — SODIUM CHLORIDE 0.9 % IV SOLN
INTRAVENOUS | Status: DC | PRN
  Administered 2018-02-28: 15 ug/min via INTRAVENOUS

## 2018-02-28 MED ORDER — MEPERIDINE HCL 50 MG/ML IJ SOLN: 6.2500 mg | INTRAMUSCULAR | Status: DC | PRN

## 2018-02-28 MED ORDER — POTASSIUM CHLORIDE 10 MEQ/100ML IV SOLN
10.0000 meq | INTRAVENOUS | Status: DC
  Filled 2018-02-28 (×3): qty 100

## 2018-02-28 SURGICAL SUPPLY — 93 items
APPLIER CLIP 5 13 M/L LIGAMAX5 (MISCELLANEOUS)
BANDAGE ACE 4X5 VEL STRL LF (GAUZE/BANDAGES/DRESSINGS) ×5 IMPLANT
BANDAGE ACE 6X5 VEL STRL LF (GAUZE/BANDAGES/DRESSINGS) ×5 IMPLANT
BAR GLASS FIBER EXFX 11X300 (EXFIX) ×10 IMPLANT
BIT DRILL ORANGE 4.0 LONG (BIT) ×5 IMPLANT
BLADE CLIPPER SURG (BLADE) IMPLANT
BNDG COHESIVE 4X5 TAN STRL (GAUZE/BANDAGES/DRESSINGS) ×5 IMPLANT
BNDG COHESIVE 6X5 TAN STRL LF (GAUZE/BANDAGES/DRESSINGS) ×5 IMPLANT
BNDG GAUZE ELAST 4 BULKY (GAUZE/BANDAGES/DRESSINGS) ×5 IMPLANT
BRUSH SCRUB SURG 4.25 DISP (MISCELLANEOUS) ×10 IMPLANT
CANISTER SUCT 3000ML PPV (MISCELLANEOUS) ×10 IMPLANT
CHLORAPREP W/TINT 26ML (MISCELLANEOUS) ×20 IMPLANT
CLIP APPLIE 5 13 M/L LIGAMAX5 (MISCELLANEOUS) IMPLANT
COVER MAYO STAND STRL (DRAPES) ×5 IMPLANT
COVER SURGICAL LIGHT HANDLE (MISCELLANEOUS) ×25 IMPLANT
COVER WAND RF STERILE (DRAPES) ×10 IMPLANT
DECANTER SPIKE VIAL GLASS SM (MISCELLANEOUS) ×10 IMPLANT
DERMABOND ADVANCED (GAUZE/BANDAGES/DRESSINGS) ×1
DERMABOND ADVANCED .7 DNX12 (GAUZE/BANDAGES/DRESSINGS) ×4 IMPLANT
DRAPE C-ARM 42X72 X-RAY (DRAPES) IMPLANT
DRAPE C-ARMOR (DRAPES) ×5 IMPLANT
DRAPE IMP U-DRAPE 54X76 (DRAPES) ×20 IMPLANT
DRAPE LAPAROSCOPIC ABDOMINAL (DRAPES) ×5 IMPLANT
DRAPE ORTHO SPLIT 77X108 STRL (DRAPES) ×4
DRAPE SURG ORHT 6 SPLT 77X108 (DRAPES) ×16 IMPLANT
DRAPE U-SHAPE 47X51 STRL (DRAPES) ×5 IMPLANT
DRAPE WARM FLUID 44X44 (DRAPE) ×10 IMPLANT
DRSG MEPITEL 4X7.2 (GAUZE/BANDAGES/DRESSINGS) IMPLANT
DRSG OPSITE POSTOP 4X10 (GAUZE/BANDAGES/DRESSINGS) IMPLANT
DRSG OPSITE POSTOP 4X8 (GAUZE/BANDAGES/DRESSINGS) IMPLANT
DRSG TEGADERM 4X4.75 (GAUZE/BANDAGES/DRESSINGS) ×5 IMPLANT
DRSG VAC ATS MED SENSATRAC (GAUZE/BANDAGES/DRESSINGS) ×5 IMPLANT
ELECT BLADE 6.5 EXT (BLADE) ×5 IMPLANT
ELECT CAUTERY BLADE 6.4 (BLADE) ×10 IMPLANT
ELECT REM PT RETURN 9FT ADLT (ELECTROSURGICAL) ×20
ELECTRODE REM PT RTRN 9FT ADLT (ELECTROSURGICAL) ×16 IMPLANT
GAUZE SPONGE 4X4 12PLY STRL (GAUZE/BANDAGES/DRESSINGS) ×10 IMPLANT
GLOVE BIO SURGEON STRL SZ7.5 (GLOVE) ×45 IMPLANT
GLOVE BIOGEL PI IND STRL 7.5 (GLOVE) ×12 IMPLANT
GLOVE BIOGEL PI IND STRL 8 (GLOVE) ×8 IMPLANT
GLOVE BIOGEL PI INDICATOR 7.5 (GLOVE) ×3
GLOVE BIOGEL PI INDICATOR 8 (GLOVE) ×2
GLOVE ECLIPSE 7.5 STRL STRAW (GLOVE) ×10 IMPLANT
GLOVE SURG SS PI 6.5 STRL IVOR (GLOVE) ×5 IMPLANT
GOWN STRL REUS W/ TWL LRG LVL3 (GOWN DISPOSABLE) ×32 IMPLANT
GOWN STRL REUS W/TWL LRG LVL3 (GOWN DISPOSABLE) ×8
KIT BASIN OR (CUSTOM PROCEDURE TRAY) ×20 IMPLANT
KIT OSTOMY DRAINABLE 2.75 STR (WOUND CARE) ×5 IMPLANT
KIT TURNOVER KIT B (KITS) ×20 IMPLANT
LIGASURE IMPACT 36 18CM CVD LR (INSTRUMENTS) ×5 IMPLANT
MANIFOLD NEPTUNE II (INSTRUMENTS) ×10 IMPLANT
NEEDLE 22X1 1/2 (OR ONLY) (NEEDLE) IMPLANT
NS IRRIG 1000ML POUR BTL (IV SOLUTION) ×25 IMPLANT
PACK GENERAL/GYN (CUSTOM PROCEDURE TRAY) ×10 IMPLANT
PACK ORTHO EXTREMITY (CUSTOM PROCEDURE TRAY) ×5 IMPLANT
PAD ARMBOARD 7.5X6 YLW CONV (MISCELLANEOUS) ×35 IMPLANT
PADDING CAST COTTON 6X4 STRL (CAST SUPPLIES) IMPLANT
PENCIL BUTTON HOLSTER BLD 10FT (ELECTRODE) ×5 IMPLANT
PIN CLAMP 2BAR 75MM BLUE (EXFIX) ×10 IMPLANT
PIN XTRAFIX LG BLUNT 5X200X45 (PIN) ×20 IMPLANT
RELOAD PROXIMATE 75MM BLUE (ENDOMECHANICALS) ×10 IMPLANT
RELOAD PROXIMATE TA60MM BLUE (ENDOMECHANICALS) ×5 IMPLANT
SCISSORS LAP 5X35 DISP (ENDOMECHANICALS) IMPLANT
SEPRAFILM PROCEDURAL PACK 3X5 (MISCELLANEOUS) IMPLANT
SET IRRIG TUBING LAPAROSCOPIC (IRRIGATION / IRRIGATOR) IMPLANT
SLEEVE ENDOPATH XCEL 5M (ENDOMECHANICALS) ×5 IMPLANT
SPECIMEN JAR LARGE (MISCELLANEOUS) IMPLANT
SPONGE LAP 18X18 X RAY DECT (DISPOSABLE) ×15 IMPLANT
STAPLER GUN LINEAR PROX 60 (STAPLE) ×5 IMPLANT
STAPLER PROXIMATE 75MM BLUE (STAPLE) ×5 IMPLANT
STAPLER VISISTAT 35W (STAPLE) ×20 IMPLANT
STRIP CLOSURE SKIN 1/2X4 (GAUZE/BANDAGES/DRESSINGS) IMPLANT
SUCTION POOLE TIP (SUCTIONS) ×10 IMPLANT
SUT MNCRL AB 4-0 PS2 18 (SUTURE) ×5 IMPLANT
SUT NOVA 1 T20/GS 25DT (SUTURE) IMPLANT
SUT PDS AB 1 TP1 96 (SUTURE) ×10 IMPLANT
SUT PROLENE 0 CT (SUTURE) IMPLANT
SUT PROLENE 2 0 SH 30 (SUTURE) ×5 IMPLANT
SUT SILK 2 0 SH CR/8 (SUTURE) ×15 IMPLANT
SUT SILK 2 0 TIES 10X30 (SUTURE) ×15 IMPLANT
SUT SILK 3 0 SH CR/8 (SUTURE) ×10 IMPLANT
SUT SILK 3 0 TIES 10X30 (SUTURE) ×10 IMPLANT
TOWEL OR 17X24 6PK STRL BLUE (TOWEL DISPOSABLE) ×25 IMPLANT
TOWEL OR 17X26 10 PK STRL BLUE (TOWEL DISPOSABLE) ×30 IMPLANT
TRAY FOLEY MTR SLVR 16FR STAT (SET/KITS/TRAYS/PACK) ×5 IMPLANT
TRAY LAPAROSCOPIC MC (CUSTOM PROCEDURE TRAY) ×5 IMPLANT
TROCAR XCEL BLUNT TIP 100MML (ENDOMECHANICALS) ×5 IMPLANT
TROCAR XCEL NON-BLD 11X100MML (ENDOMECHANICALS) IMPLANT
TROCAR XCEL NON-BLD 5MMX100MML (ENDOMECHANICALS) ×5 IMPLANT
TUBING INSUFFLATION (TUBING) ×5 IMPLANT
UNDERPAD 30X30 (UNDERPADS AND DIAPERS) ×10 IMPLANT
WND VAC CANISTER 500ML (MISCELLANEOUS) ×5 IMPLANT
YANKAUER SUCT BULB TIP NO VENT (SUCTIONS) ×10 IMPLANT
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
## 2018-02-28 NOTE — Progress Notes (Signed)
Orthopedic Tech Progress Note Patient Details:  Richard E Fenech Jr. [birth year] [id number]  Musculoskeletal Traction Type of Traction: Gilberto Better Traction Traction Location: rle   Post Interventions Patient Tolerated: Well Instructions Provided: Care of device, Adjustment of device Applied at drs request with drs assistance.  Trinna Post 02/28/2018, 7:38 AM

## 2018-02-28 NOTE — ED Notes (Signed)
Christy, RN called from OR to request patient be brought up at this time

## 2018-02-28 NOTE — Consult Note (Addendum)
WOC consult requested for new ileostomy which was performed today and Vac dressing to abd.  Pt is currently in the OR; WOC team will assess ostomy pouching situation tomorrow and plan to perform first post-op dressing change with PA at the bedside to assess wound appearance on Friday. Cammie Mcgee MSN, RN, CWOCN, [place], CNS [phone]
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
## 2018-02-28 NOTE — ED Notes (Signed)
Patient transported to CT by Lowella Bandy, [employer] fund[employer]

## 2018-02-28 NOTE — Anesthesia Procedure Notes (Addendum)
Procedure Name: Intubation Date/Time: 02/28/2018 8:52 AM Performed by: Elliot Dally, CRNA Pre-anesthesia Checklist: Patient identified, Emergency Drugs available, Suction available and Patient being monitored Patient Re-evaluated:Patient Re-evaluated prior to induction Oxygen Delivery Method: Circle System Utilized Preoxygenation: Pre-oxygenation with 100% oxygen Induction Type: IV induction, Rapid sequence and Cricoid Pressure applied Laryngoscope Size: Glidescope and 3 Grade View: Grade I Tube type: Oral Tube size: 7.5 mm Number of attempts: 1 Airway Equipment and Method: Stylet and Oral airway Placement Confirmation: ETT inserted through vocal cords under direct vision,  positive ETCO2 and breath sounds checked- equal and bilateral Secured at: 22 cm Tube secured with: Tape Dental Injury: Teeth and Oropharynx as per pre-operative assessment  Comments: Elective glide scope to maintain C-spine
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
## 2018-02-28 NOTE — ED Notes (Signed)
ISTAT CHEM 8& ISTAT LACTIC WAS SHOWN TO DR.WYATT.NURSE IS AWEAR  ASWELL,
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
## 2018-02-28 NOTE — ED Notes (Signed)
Family at bedside. 

## 2018-02-28 NOTE — Progress Notes (Signed)
Spoke with Dr. Lindie Spruce regarding pt tachycardia mid to upper 140's. Informed him pt stated PCA not effective indicating he was not going to use it. Also, nursing noted pt to be sleeping continuously, easily awakens to voice but does not hold conversation as he is quite drowsy. New orders received, will continue to monitor.

## 2018-02-28 NOTE — Anesthesia Postprocedure Evaluation (Signed)
Anesthesia Post Note  Patient: Richard E Schwanz Jr.  Procedure(s) Performed: EXPLORATORY LAPAROTOMY (N/A Abdomen) SMALL BOWEL RESECTION (N/A Abdomen) APPLICATION OF WOUND VAC (N/A Abdomen) EXTERNAL FIXATION RIGHT LEG (Right Leg Upper)     Patient location during evaluation: PACU Anesthesia Type: General Level of consciousness: awake and alert Pain management: pain level controlled Vital Signs Assessment: post-procedure vital signs reviewed and stable Respiratory status: spontaneous breathing, nonlabored ventilation, respiratory function stable and patient connected to nasal cannula oxygen Cardiovascular status: blood pressure returned to baseline and stable Postop Assessment: no apparent nausea or vomiting Anesthetic complications: no    Last Vitals:  Vitals:   02/28/18 1406 02/28/18 1500  BP: (!) 132/93 (!) 144/83  Pulse:  (!) 102  Resp: (!) 27 (!) 24  Temp:    SpO2:  100%    Last Pain:  Vitals:   02/28/18 1550  TempSrc:   PainSc: Asleep                 Phillips Grout
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
## 2018-02-28 NOTE — ED Notes (Signed)
Ortho at bedside to set up traction

## 2018-02-28 NOTE — Transfer of Care (Signed)
Immediate Anesthesia Transfer of Care Note  Patient: Richard E Hoard Jr.  Procedure(s) Performed: EXPLORATORY LAPAROTOMY (N/A Abdomen) SMALL BOWEL RESECTION (N/A Abdomen) APPLICATION OF WOUND VAC (N/A Abdomen) EXTERNAL FIXATION RIGHT LEG (Right Leg Upper)  Patient Location: PACU  Anesthesia Type:General  Level of Consciousness: drowsy  Airway & Oxygen Therapy: Patient Spontanous Breathing and Patient connected to nasal cannula oxygen  Post-op Assessment: Report given to RN and Post -op Vital signs reviewed and stable  Post vital signs: Reviewed and stable  Last Vitals:  Vitals Value Taken Time  BP 156/86 02/28/2018 12:36 PM  Temp    Pulse 118 02/28/2018 12:39 PM  Resp 25 02/28/2018 12:39 PM  SpO2 90 % 02/28/2018 12:39 PM  Vitals shown include unvalidated device data.  Last Pain:  Vitals:   02/28/18 6045  TempSrc:   PainSc: 10-Worst pain ever         Complications: No apparent anesthesia complications

## 2018-02-28 NOTE — Progress Notes (Signed)
Pt transfer to 4NICU from PACU 1400.  VSS.  RN will continue to monitor.
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
## 2018-02-28 NOTE — ED Notes (Signed)
Orthopedic surgeon at bedside. 

## 2018-03-01 ENCOUNTER — Encounter (HOSPITAL_COMMUNITY): Payer: Self-pay | Admitting: General Surgery

## 2018-03-01 ENCOUNTER — Inpatient Hospital Stay (HOSPITAL_COMMUNITY): Payer: BLUE CROSS/BLUE SHIELD

## 2018-03-01 ENCOUNTER — Inpatient Hospital Stay (HOSPITAL_COMMUNITY): Payer: BLUE CROSS/BLUE SHIELD | Admitting: Anesthesiology

## 2018-03-01 ENCOUNTER — Encounter (HOSPITAL_COMMUNITY): Admission: EM | Disposition: A | Discharge status: Home Health | Payer: Self-pay | Source: Home / Self Care

## 2018-03-01 HISTORY — PX: FEMUR IM NAIL: SHX1597

## 2018-03-01 HISTORY — PX: EXTERNAL FIXATION REMOVAL: SHX5040

## 2018-03-01 LAB — COMPREHENSIVE METABOLIC PANEL
ALK PHOS: 41 U/L (ref 38–126)
ALT: 55 U/L — AB (ref 0–44)
AST: 105 U/L — AB (ref 15–41)
Albumin: 3.2 g/dL — ABNORMAL LOW (ref 3.5–5.0)
Anion gap: 5 (ref 5–15)
BUN: 15 mg/dL (ref 6–20)
CHLORIDE: 110 mmol/L (ref 98–111)
CO2: 23 mmol/L (ref 22–32)
CREATININE: 1.07 mg/dL (ref 0.61–1.24)
Calcium: 8 mg/dL — ABNORMAL LOW (ref 8.9–10.3)
Glucose, Bld: 160 mg/dL — ABNORMAL HIGH (ref 70–99)
Potassium: 4.5 mmol/L (ref 3.5–5.1)
SODIUM: 138 mmol/L (ref 135–145)
Total Bilirubin: 0.8 mg/dL (ref 0.3–1.2)
Total Protein: 5.6 g/dL — ABNORMAL LOW (ref 6.5–8.1)

## 2018-03-01 LAB — CBC
HEMATOCRIT: 31.6 % — AB (ref 39.0–52.0)
Hemoglobin: 10 g/dL — ABNORMAL LOW (ref 13.0–17.0)
MCH: 27.2 pg (ref 26.0–34.0)
MCHC: 31.6 g/dL (ref 30.0–36.0)
MCV: 85.9 fL (ref 80.0–100.0)
NRBC: 0 % (ref 0.0–0.2)
PLATELETS: 197 10*3/uL (ref 150–400)
RBC: 3.68 MIL/uL — ABNORMAL LOW (ref 4.22–5.81)
RDW: 12.8 % (ref 11.5–15.5)
WBC: 12.5 10*3/uL — ABNORMAL HIGH (ref 4.0–10.5)

## 2018-03-01 LAB — URINALYSIS, ROUTINE W REFLEX MICROSCOPIC
Bilirubin Urine: NEGATIVE
GLUCOSE, UA: NEGATIVE mg/dL
KETONES UR: NEGATIVE mg/dL
LEUKOCYTES UA: NEGATIVE
Nitrite: NEGATIVE
PROTEIN: NEGATIVE mg/dL
Specific Gravity, Urine: 1.02 (ref 1.005–1.030)
pH: 5 (ref 5.0–8.0)

## 2018-03-01 LAB — POCT I-STAT 4, (NA,K, GLUC, HGB,HCT)
Glucose, Bld: 162 mg/dL — ABNORMAL HIGH (ref 70–99)
HEMATOCRIT: 35 % — AB (ref 39.0–52.0)
Hemoglobin: 11.9 g/dL — ABNORMAL LOW (ref 13.0–17.0)
Potassium: 3.6 mmol/L (ref 3.5–5.1)
Sodium: 143 mmol/L (ref 135–145)

## 2018-03-01 LAB — TRIGLYCERIDES: TRIGLYCERIDES: 69 mg/dL (ref <150)

## 2018-03-01 LAB — POCT I-STAT 3, ART BLOOD GAS (G3+)
Acid-base deficit: 1 mmol/L (ref 0.0–2.0)
Bicarbonate: 26 mmol/L (ref 20.0–28.0)
O2 Saturation: 100 %
PCO2 ART: 51.4 mmHg — AB (ref 32.0–48.0)
TCO2: 28 mmol/L (ref 22–32)
pH, Arterial: 7.31 — ABNORMAL LOW (ref 7.350–7.450)
pO2, Arterial: 258 mmHg — ABNORMAL HIGH (ref 83.0–108.0)

## 2018-03-01 LAB — BLOOD PRODUCT ORDER (VERBAL) VERIFICATION

## 2018-03-01 LAB — HIV ANTIBODY (ROUTINE TESTING W REFLEX): HIV SCREEN 4TH GENERATION: NONREACTIVE

## 2018-03-01 SURGERY — INSERTION, INTRAMEDULLARY ROD, FEMUR
Anesthesia: General | Site: Leg Upper | Laterality: Right

## 2018-03-01 MED ORDER — ONDANSETRON HCL 4 MG/2ML IJ SOLN
INTRAMUSCULAR | Status: DC | PRN
  Administered 2018-03-01: 4 mg via INTRAVENOUS

## 2018-03-01 MED ORDER — LACTATED RINGERS IV SOLN
INTRAVENOUS | Status: DC
  Administered 2018-03-01: 10:00:00 via INTRAVENOUS

## 2018-03-01 MED ORDER — SUGAMMADEX SODIUM 500 MG/5ML IV SOLN
INTRAVENOUS | Status: AC
  Filled 2018-03-01: qty 5

## 2018-03-01 MED ORDER — ORAL CARE MOUTH RINSE: 15.0000 mL | Freq: Two times a day (BID) | OROMUCOSAL | Status: DC

## 2018-03-01 MED ORDER — CEFAZOLIN SODIUM-DEXTROSE 2-4 GM/100ML-% IV SOLN
2.0000 g | Freq: Three times a day (TID) | INTRAVENOUS | Status: DC
  Filled 2018-03-01: qty 100

## 2018-03-01 MED ORDER — MIDAZOLAM HCL 2 MG/2ML IJ SOLN
INTRAMUSCULAR | Status: AC
  Filled 2018-03-01: qty 2

## 2018-03-01 MED ORDER — MIDAZOLAM HCL 5 MG/5ML IJ SOLN
INTRAMUSCULAR | Status: DC | PRN
  Administered 2018-03-01: 2 mg via INTRAVENOUS

## 2018-03-01 MED ORDER — PROPOFOL 1000 MG/100ML IV EMUL
0.0000 ug/kg/min | INTRAVENOUS | Status: DC
  Administered 2018-03-01: 5 ug/kg/min via INTRAVENOUS
  Administered 2018-03-02: 10 ug/kg/min via INTRAVENOUS
  Filled 2018-03-01 (×2): qty 100

## 2018-03-01 MED ORDER — DEXAMETHASONE SODIUM PHOSPHATE 10 MG/ML IJ SOLN
INTRAMUSCULAR | Status: AC
  Filled 2018-03-01: qty 2

## 2018-03-01 MED ORDER — ROCURONIUM BROMIDE 50 MG/5ML IV SOSY
PREFILLED_SYRINGE | INTRAVENOUS | Status: DC | PRN
  Administered 2018-03-01: 50 mg via INTRAVENOUS
  Administered 2018-03-01: 20 mg via INTRAVENOUS

## 2018-03-01 MED ORDER — FENTANYL 2500MCG IN NS 250ML (10MCG/ML) PREMIX INFUSION
25.0000 ug/h | INTRAVENOUS | Status: DC
  Administered 2018-03-01: 50 ug/h via INTRAVENOUS
  Filled 2018-03-01: qty 250

## 2018-03-01 MED ORDER — PROPOFOL 10 MG/ML IV BOLUS
INTRAVENOUS | Status: DC | PRN
  Administered 2018-03-01: 120 mg via INTRAVENOUS
  Administered 2018-03-01: 30 mg via INTRAVENOUS

## 2018-03-01 MED ORDER — LACTATED RINGERS IV SOLN
INTRAVENOUS | Status: DC | PRN
  Administered 2018-03-01 (×3): via INTRAVENOUS

## 2018-03-01 MED ORDER — ORAL CARE MOUTH RINSE
15.0000 mL | OROMUCOSAL | Status: DC
  Administered 2018-03-01 – 2018-03-02 (×9): 15 mL via OROMUCOSAL

## 2018-03-01 MED ORDER — ALBUMIN HUMAN 25 % IV SOLN
12.5000 g | Freq: Once | INTRAVENOUS | Status: AC
  Administered 2018-03-01: 12.5 g via INTRAVENOUS
  Filled 2018-03-01: qty 50

## 2018-03-01 MED ORDER — CEFAZOLIN SODIUM-DEXTROSE 2-4 GM/100ML-% IV SOLN
INTRAVENOUS | Status: AC
  Filled 2018-03-01: qty 100

## 2018-03-01 MED ORDER — FENTANYL CITRATE (PF) 100 MCG/2ML IJ SOLN
INTRAMUSCULAR | Status: DC | PRN
  Administered 2018-03-01: 25 ug via INTRAVENOUS
  Administered 2018-03-01 (×3): 50 ug via INTRAVENOUS
  Administered 2018-03-01: 75 ug via INTRAVENOUS

## 2018-03-01 MED ORDER — PHENYLEPHRINE HCL 10 MG/ML IJ SOLN
INTRAMUSCULAR | Status: DC | PRN
  Administered 2018-03-01: 40 ug via INTRAVENOUS
  Administered 2018-03-01: 80 ug via INTRAVENOUS
  Administered 2018-03-01: 40 ug via INTRAVENOUS
  Administered 2018-03-01 (×2): 80 ug via INTRAVENOUS

## 2018-03-01 MED ORDER — ONDANSETRON HCL 4 MG/2ML IJ SOLN
INTRAMUSCULAR | Status: AC
  Filled 2018-03-01: qty 4

## 2018-03-01 MED ORDER — PROPOFOL 10 MG/ML IV BOLUS
INTRAVENOUS | Status: AC
  Filled 2018-03-01: qty 20

## 2018-03-01 MED ORDER — SODIUM CHLORIDE 0.9 % IV SOLN
INTRAVENOUS | Status: DC | PRN
  Administered 2018-03-01: 500 mL via INTRAVENOUS
  Administered 2018-03-08: 1000 mL via INTRAVENOUS
  Administered 2018-03-10: 500 mL via INTRAVENOUS
  Administered 2018-03-28: 1000 mL via INTRAVENOUS

## 2018-03-01 MED ORDER — FENTANYL CITRATE (PF) 100 MCG/2ML IJ SOLN
50.0000 ug | Freq: Once | INTRAMUSCULAR | Status: AC
  Administered 2018-03-01: 50 ug via INTRAVENOUS

## 2018-03-01 MED ORDER — LIDOCAINE 2% (20 MG/ML) 5 ML SYRINGE
INTRAMUSCULAR | Status: AC
  Filled 2018-03-01: qty 5

## 2018-03-01 MED ORDER — ROCURONIUM BROMIDE 50 MG/5ML IV SOSY
PREFILLED_SYRINGE | INTRAVENOUS | Status: AC
  Filled 2018-03-01: qty 5

## 2018-03-01 MED ORDER — SODIUM CHLORIDE 0.9 % IV SOLN
INTRAVENOUS | Status: DC | PRN
  Administered 2018-03-01: 50 ug/min via INTRAVENOUS

## 2018-03-01 MED ORDER — FENTANYL BOLUS VIA INFUSION
50.0000 ug | INTRAVENOUS | Status: DC | PRN
  Administered 2018-03-01: 50 ug via INTRAVENOUS
  Filled 2018-03-01: qty 50

## 2018-03-01 MED ORDER — 0.9 % SODIUM CHLORIDE (POUR BTL) OPTIME
TOPICAL | Status: DC | PRN
  Administered 2018-03-01: 1000 mL

## 2018-03-01 MED ORDER — CHLORHEXIDINE GLUCONATE 0.12% ORAL RINSE (MEDLINE KIT)
15.0000 mL | Freq: Two times a day (BID) | OROMUCOSAL | Status: DC
  Administered 2018-03-01 – 2018-03-15 (×25): 15 mL via OROMUCOSAL

## 2018-03-01 MED ORDER — ALBUMIN HUMAN 5 % IV SOLN
INTRAVENOUS | Status: DC | PRN
  Administered 2018-03-01: 11:00:00 via INTRAVENOUS

## 2018-03-01 MED ORDER — CEFAZOLIN SODIUM-DEXTROSE 2-3 GM-%(50ML) IV SOLR
INTRAVENOUS | Status: DC | PRN
  Administered 2018-03-01: 2 g via INTRAVENOUS

## 2018-03-01 MED ORDER — VANCOMYCIN HCL 1000 MG IV SOLR
INTRAVENOUS | Status: DC | PRN
  Administered 2018-03-01: 1000 mg via TOPICAL

## 2018-03-01 MED ORDER — LIDOCAINE 2% (20 MG/ML) 5 ML SYRINGE
INTRAMUSCULAR | Status: DC | PRN
  Administered 2018-03-01: 100 mg via INTRAVENOUS

## 2018-03-01 MED ORDER — VANCOMYCIN HCL 1000 MG IV SOLR
INTRAVENOUS | Status: AC
  Filled 2018-03-01: qty 1000

## 2018-03-01 MED ORDER — ROCURONIUM BROMIDE 50 MG/5ML IV SOSY
PREFILLED_SYRINGE | INTRAVENOUS | Status: AC
  Filled 2018-03-01: qty 10

## 2018-03-01 MED ORDER — FENTANYL CITRATE (PF) 250 MCG/5ML IJ SOLN
INTRAMUSCULAR | Status: AC
  Filled 2018-03-01: qty 5

## 2018-03-01 MED ORDER — HYDROMORPHONE HCL 1 MG/ML IJ SOLN
INTRAMUSCULAR | Status: AC
  Filled 2018-03-01: qty 0.5

## 2018-03-01 SURGICAL SUPPLY — 60 items
BANDAGE ACE 4X5 VEL STRL LF (GAUZE/BANDAGES/DRESSINGS) ×2 IMPLANT
BANDAGE ACE 6X5 VEL STRL LF (GAUZE/BANDAGES/DRESSINGS) ×2 IMPLANT
BANDAGE ELASTIC 6 VELCRO ST LF (GAUZE/BANDAGES/DRESSINGS) IMPLANT
BIT DRILL CALIBRATED 4.2 (BIT) ×1 IMPLANT
BIT DRILL SHORT 4.2 (BIT) ×2 IMPLANT
BLADE SURG 10 STRL SS (BLADE) ×2 IMPLANT
BNDG COHESIVE 4X5 TAN STRL (GAUZE/BANDAGES/DRESSINGS) ×2 IMPLANT
BNDG COHESIVE 6X5 TAN STRL LF (GAUZE/BANDAGES/DRESSINGS) IMPLANT
BRUSH SCRUB SURG 4.25 DISP (MISCELLANEOUS) ×4 IMPLANT
CHLORAPREP W/TINT 26ML (MISCELLANEOUS) ×6 IMPLANT
COVER SURGICAL LIGHT HANDLE (MISCELLANEOUS) ×2 IMPLANT
COVER WAND RF STERILE (DRAPES) ×2 IMPLANT
DRAPE C-ARM 35X43 STRL (DRAPES) ×2 IMPLANT
DRAPE C-ARMOR (DRAPES) ×2 IMPLANT
DRAPE HALF SHEET 40X57 (DRAPES) ×4 IMPLANT
DRAPE IMP U-DRAPE 54X76 (DRAPES) ×4 IMPLANT
DRAPE INCISE IOBAN 66X45 STRL (DRAPES) ×2 IMPLANT
DRAPE ORTHO SPLIT 77X108 STRL (DRAPES) ×3
DRAPE SURG 17X23 STRL (DRAPES) ×2 IMPLANT
DRAPE SURG ORHT 6 SPLT 77X108 (DRAPES) ×3 IMPLANT
DRAPE U-SHAPE 47X51 STRL (DRAPES) ×2 IMPLANT
DRILL BIT CALIBRATED 4.2 (BIT) ×2
DRILL BIT SHORT 4.2 (BIT) ×2
DRSG MEPILEX BORDER 4X12 (GAUZE/BANDAGES/DRESSINGS) ×2 IMPLANT
DRSG MEPILEX BORDER 4X4 (GAUZE/BANDAGES/DRESSINGS) ×2 IMPLANT
DRSG MEPILEX BORDER 4X8 (GAUZE/BANDAGES/DRESSINGS) ×4 IMPLANT
ELECT REM PT RETURN 9FT ADLT (ELECTROSURGICAL) ×2
ELECTRODE REM PT RTRN 9FT ADLT (ELECTROSURGICAL) ×1 IMPLANT
GLOVE BIO SURGEON STRL SZ7.5 (GLOVE) ×6 IMPLANT
GLOVE BIOGEL PI IND STRL 7.5 (GLOVE) ×1 IMPLANT
GLOVE BIOGEL PI INDICATOR 7.5 (GLOVE) ×1
GOWN STRL REUS W/ TWL LRG LVL3 (GOWN DISPOSABLE) ×3 IMPLANT
GOWN STRL REUS W/TWL LRG LVL3 (GOWN DISPOSABLE) ×3
GOWN STRL REUS W/TWL XL LVL3 (GOWN DISPOSABLE) ×2 IMPLANT
GUIDEWIRE 3.2X400 (WIRE) ×2 IMPLANT
KIT BASIN OR (CUSTOM PROCEDURE TRAY) ×2 IMPLANT
KIT TURNOVER KIT B (KITS) ×2 IMPLANT
MANIFOLD NEPTUNE II (INSTRUMENTS) ×2 IMPLANT
NAIL CANN FRN 10X400 RT STER (Nail) ×2 IMPLANT
NS IRRIG 1000ML POUR BTL (IV SOLUTION) ×2 IMPLANT
PACK GENERAL/GYN (CUSTOM PROCEDURE TRAY) ×2 IMPLANT
PAD ARMBOARD 7.5X6 YLW CONV (MISCELLANEOUS) ×4 IMPLANT
REAMER ROD DEEP FLUTE 2.5X950 (INSTRUMENTS) ×2 IMPLANT
SCREW CANN LOCK FT STRDR 5X70 (Screw) ×2 IMPLANT
SCREW LOCKING 5.0MMX40MM (Screw) ×2 IMPLANT
SCREW LOCKING 5.0MMX44MM (Screw) ×2 IMPLANT
SCREW LOCKING 5.0MX50M (Screw) ×2 IMPLANT
SCREW LOCKING 5.0X38MM (Screw) ×2 IMPLANT
STAPLER VISISTAT 35W (STAPLE) ×2 IMPLANT
STOCKINETTE IMPERVIOUS LG (DRAPES) ×2 IMPLANT
SUT ETHILON 3 0 PS 1 (SUTURE) ×2 IMPLANT
SUT MNCRL AB 3-0 PS2 18 (SUTURE) ×2 IMPLANT
SUT VIC AB 0 CT1 27 (SUTURE) ×3
SUT VIC AB 0 CT1 27XBRD ANBCTR (SUTURE) ×3 IMPLANT
SUT VIC AB 2-0 CT1 27 (SUTURE) ×1
SUT VIC AB 2-0 CT1 TAPERPNT 27 (SUTURE) ×1 IMPLANT
TOWEL OR 17X24 6PK STRL BLUE (TOWEL DISPOSABLE) ×2 IMPLANT
TOWEL OR 17X26 10 PK STRL BLUE (TOWEL DISPOSABLE) ×4 IMPLANT
UNDERPAD 30X30 (UNDERPADS AND DIAPERS) ×2 IMPLANT
WATER STERILE IRR 1000ML POUR (IV SOLUTION) IMPLANT

## 2018-03-01 NOTE — OR Nursing (Signed)
Ex-fix removed from right upper femur

## 2018-03-01 NOTE — Consult Note (Signed)
WOC follow-up: Pt is back in the OR today.  WOC will perform first post-op Vac dressing and ostomy pouch change tomorrow. Cammie Mcgee MSN, RN, CWOCN, [place], CNS [phone]

## 2018-03-01 NOTE — Transfer of Care (Signed)
Immediate Anesthesia Transfer of Care Note  Patient: Richard E Golomb Jr.  Procedure(s) Performed: INTRAMEDULLARY (IM) NAIL FEMORAL (Right Leg Upper) REMOVAL EXTERNAL FIXATION LEG (Right Leg Upper)  Patient Location: NICU  Anesthesia Type:General  Level of Consciousness: Patient remains intubated per anesthesia plan  Airway & Oxygen Therapy: Patient remains intubated per anesthesia plan and Patient placed on Ventilator (see vital sign flow sheet for setting)  Post-op Assessment: Report given to RN and Post -op Vital signs reviewed and stable  Post vital signs: Reviewed and stable  Last Vitals:  Vitals Value Taken Time  BP    Temp    Pulse    Resp    SpO2      Last Pain:  Vitals:   03/01/18 0801  TempSrc:   PainSc: Asleep         Complications: No apparent anesthesia complications
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
## 2018-03-01 NOTE — Progress Notes (Signed)
Rate increased to 18 s/p ABG results and per discussion w/ Dr Janee Morn. No other new RT orders received.

## 2018-03-01 NOTE — Anesthesia Procedure Notes (Signed)
Procedure Name: Intubation Date/Time: 03/01/2018 10:24 AM Performed by: Neldon Newport, CRNA Pre-anesthesia Checklist: Timeout performed, Patient being monitored, Suction available, Emergency Drugs available and Patient identified Patient Re-evaluated:Patient Re-evaluated prior to induction Oxygen Delivery Method: Circle system utilized Preoxygenation: Pre-oxygenation with 100% oxygen Induction Type: IV induction Ventilation: Mask ventilation without difficulty Laryngoscope Size: Mac and 3 Grade View: Grade I Tube type: Oral Tube size: 7.5 mm Number of attempts: 2 Placement Confirmation: breath sounds checked- equal and bilateral,  positive ETCO2 and ETT inserted through vocal cords under direct vision Secured at: 22 cm Tube secured with: Tape Dental Injury: Teeth and Oropharynx as per pre-operative assessment
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
## 2018-03-01 NOTE — Progress Notes (Signed)
Wasted 19ml dilaudid from PCA syringe into charcoal hazardous waste container.  Carilyn Goodpasture RN witnessed.
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
## 2018-03-02 ENCOUNTER — Inpatient Hospital Stay (HOSPITAL_COMMUNITY): Payer: BLUE CROSS/BLUE SHIELD

## 2018-03-02 ENCOUNTER — Encounter (HOSPITAL_COMMUNITY): Payer: Self-pay | Admitting: Student

## 2018-03-02 LAB — BASIC METABOLIC PANEL
ANION GAP: 3 — AB (ref 5–15)
BUN: 11 mg/dL (ref 6–20)
CO2: 28 mmol/L (ref 22–32)
CREATININE: 0.99 mg/dL (ref 0.61–1.24)
Calcium: 8 mg/dL — ABNORMAL LOW (ref 8.9–10.3)
Chloride: 105 mmol/L (ref 98–111)
GFR calc Af Amer: >60 mL/min (ref >60)
GLUCOSE: 142 mg/dL — AB (ref 70–99)
Potassium: 4.2 mmol/L (ref 3.5–5.1)
Sodium: 136 mmol/L (ref 135–145)

## 2018-03-02 LAB — CBC
HEMATOCRIT: 21 % — AB (ref 39.0–52.0)
Hemoglobin: 6.9 g/dL — CL (ref 13.0–17.0)
MCH: 28.4 pg (ref 26.0–34.0)
MCHC: 32.9 g/dL (ref 30.0–36.0)
MCV: 86.4 fL (ref 80.0–100.0)
PLATELETS: 131 10*3/uL — AB (ref 150–400)
RBC: 2.43 MIL/uL — ABNORMAL LOW (ref 4.22–5.81)
RDW: 12.9 % (ref 11.5–15.5)
WBC: 10 10*3/uL (ref 4.0–10.5)
nRBC: 0 % (ref 0.0–0.2)

## 2018-03-02 LAB — PREPARE RBC (CROSSMATCH)

## 2018-03-02 MED ORDER — FUROSEMIDE 10 MG/ML IJ SOLN
40.0000 mg | Freq: Once | INTRAMUSCULAR | Status: AC
  Administered 2018-03-02: 40 mg via INTRAVENOUS
  Filled 2018-03-02: qty 4

## 2018-03-02 MED ORDER — SODIUM CHLORIDE 0.9% FLUSH: 9.0000 mL | INTRAVENOUS | Status: DC | PRN

## 2018-03-02 MED ORDER — DIPHENHYDRAMINE HCL 50 MG/ML IJ SOLN: 12.5000 mg | Freq: Four times a day (QID) | INTRAMUSCULAR | Status: DC | PRN

## 2018-03-02 MED ORDER — NALOXONE HCL 0.4 MG/ML IJ SOLN: 0.4000 mg | INTRAMUSCULAR | Status: DC | PRN

## 2018-03-02 MED ORDER — HYDROMORPHONE 1 MG/ML IV SOLN
INTRAVENOUS | Status: DC
  Administered 2018-03-02: 13:00:00 via INTRAVENOUS
  Administered 2018-03-02: 1.7 mg via INTRAVENOUS
  Administered 2018-03-03 (×2): 0.9 mg via INTRAVENOUS
  Administered 2018-03-03: 1.8 mg via INTRAVENOUS
  Administered 2018-03-03: 1.2 mg via INTRAVENOUS
  Administered 2018-03-03: 2.1 mg via INTRAVENOUS
  Administered 2018-03-04: 0.9 mg via INTRAVENOUS
  Administered 2018-03-04: 2.1 mg via INTRAVENOUS
  Filled 2018-03-02 (×2): qty 25

## 2018-03-02 MED ORDER — FUROSEMIDE 10 MG/ML IJ SOLN: 20.0000 mg | Freq: Once | INTRAMUSCULAR | Status: DC

## 2018-03-02 MED ORDER — ONDANSETRON HCL 4 MG/2ML IJ SOLN: 4.0000 mg | Freq: Four times a day (QID) | INTRAMUSCULAR | Status: DC | PRN

## 2018-03-02 MED ORDER — DIPHENHYDRAMINE HCL 12.5 MG/5ML PO ELIX: 12.5000 mg | ORAL_SOLUTION | Freq: Four times a day (QID) | ORAL | Status: DC | PRN

## 2018-03-02 MED ORDER — ORAL CARE MOUTH RINSE
15.0000 mL | Freq: Two times a day (BID) | OROMUCOSAL | Status: DC
  Administered 2018-03-02 – 2018-03-14 (×11): 15 mL via OROMUCOSAL

## 2018-03-02 NOTE — Procedures (Signed)
Extubation Procedure Note  Patient Details:   Name: Richard E Suire Jr. DOB: Sep 10, 1995 MRN: [id number]   Airway Documentation:    Vent end date: 03/02/18 Vent end time: 1125   Evaluation  O2 sats: stable throughout Complications: No apparent complications Patient did tolerate procedure well. Bilateral Breath Sounds: Clear, Diminished   Yes: patient was able to breathe around the cuff prior to extubation and speak post extubation.  Nell Range 03/02/2018, 11:38 AM
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
## 2018-03-02 NOTE — Plan of Care (Signed)
Patient pain level manageable after initiation of PCA pump.

## 2018-03-02 NOTE — Consult Note (Addendum)
WOC Nurse wound consult note Reason for Consult: Vac change performed to midline full thickness post-op abd wound.  Pt was medicated for pain prior to the procedure and tolerated with mod amt discomfort. Wound bed: beefy red; 19X4X3cm Drainage (amount, consistency, odor) small amt bloody drainage Periwound: intact skin surrounding Dressing procedure/placement/frequency: Applied one piece black foam to cont suction. WOC will plan to perform pouch change Q M/W/F since the ostomy pouch is located in close proximity.  WOC Nurse ostomy consult note Stoma type/location: Colostomy surgery was performed 10/30.  Pt is intubated; will continue teaching sessions when stable and out of ICU.  Family member at the bedside watched the process and asked appropriate questions. Stomal assessment/size: Stoma is dusky red and covered with clotted blood, 1 3/4 inches, above skin level Peristomal assessment:  Intact skin surrounding Output: Small amt bloody drainage; no stool or flatus  Ostomy pouching: 2pc.  Education provided: Current pouch was leaking behind the barrier.  Applied barrier ring to attempt to maintain a seal and 2 piece pouching system. Enrolled patient in [employer] DC program: No Supplies at the bedside for staff nurse use. Cammie Mcgee MSN, RN, CWOCN, [place], CNS [phone]

## 2018-03-02 NOTE — Progress Notes (Signed)
Wasted 25ml fentanyl in hazardous waste container w/ Gari Crown, RN
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
## 2018-03-03 ENCOUNTER — Encounter (HOSPITAL_COMMUNITY): Payer: Self-pay | Admitting: Radiology

## 2018-03-03 ENCOUNTER — Inpatient Hospital Stay (HOSPITAL_COMMUNITY): Payer: BLUE CROSS/BLUE SHIELD

## 2018-03-03 LAB — CBC WITH DIFFERENTIAL/PLATELET
Abs Immature Granulocytes: 0.06 10*3/uL (ref 0.00–0.07)
BASOS PCT: 0 %
Basophils Absolute: 0 10*3/uL (ref 0.0–0.1)
EOS ABS: 0.1 10*3/uL (ref 0.0–0.5)
EOS PCT: 1 %
HCT: 25.8 % — ABNORMAL LOW (ref 39.0–52.0)
HEMOGLOBIN: 8.3 g/dL — AB (ref 13.0–17.0)
Immature Granulocytes: 1 %
LYMPHS ABS: 0.7 10*3/uL (ref 0.7–4.0)
Lymphocytes Relative: 7 %
MCH: 27.8 pg (ref 26.0–34.0)
MCHC: 32.2 g/dL (ref 30.0–36.0)
MCV: 86.3 fL (ref 80.0–100.0)
MONOS PCT: 8 %
Monocytes Absolute: 0.8 10*3/uL (ref 0.1–1.0)
NRBC: 0 % (ref 0.0–0.2)
Neutro Abs: 8.2 10*3/uL — ABNORMAL HIGH (ref 1.7–7.7)
Neutrophils Relative %: 83 %
PLATELETS: 165 10*3/uL (ref 150–400)
RBC: 2.99 MIL/uL — ABNORMAL LOW (ref 4.22–5.81)
RDW: 12.9 % (ref 11.5–15.5)
WBC: 9.8 10*3/uL (ref 4.0–10.5)

## 2018-03-03 LAB — BASIC METABOLIC PANEL
Anion gap: 3 — ABNORMAL LOW (ref 5–15)
BUN: 13 mg/dL (ref 6–20)
CO2: 30 mmol/L (ref 22–32)
Calcium: 8.2 mg/dL — ABNORMAL LOW (ref 8.9–10.3)
Chloride: 102 mmol/L (ref 98–111)
Creatinine, Ser: 0.92 mg/dL (ref 0.61–1.24)
GFR calc Af Amer: >60 mL/min (ref >60)
GFR calc non Af Amer: >60 mL/min (ref >60)
Glucose, Bld: 123 mg/dL — ABNORMAL HIGH (ref 70–99)
Potassium: 4.1 mmol/L (ref 3.5–5.1)
Sodium: 135 mmol/L (ref 135–145)

## 2018-03-03 MED ORDER — CHLORHEXIDINE GLUCONATE 0.12 % MT SOLN
OROMUCOSAL | Status: AC
  Filled 2018-03-03: qty 15

## 2018-03-03 MED ORDER — ACETAMINOPHEN 650 MG RE SUPP: 650.0000 mg | RECTAL | Status: DC | PRN

## 2018-03-03 MED ORDER — METOPROLOL TARTRATE 5 MG/5ML IV SOLN
5.0000 mg | Freq: Four times a day (QID) | INTRAVENOUS | Status: DC | PRN
  Administered 2018-03-03 – 2018-03-13 (×7): 5 mg via INTRAVENOUS
  Filled 2018-03-03 (×7): qty 5

## 2018-03-03 MED ORDER — IOPAMIDOL (ISOVUE-370) INJECTION 76%
INTRAVENOUS | Status: AC
  Administered 2018-03-03: 80 mL
  Filled 2018-03-03: qty 100
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
## 2018-03-04 LAB — CBC WITH DIFFERENTIAL/PLATELET
Abs Immature Granulocytes: 0.11 10*3/uL — ABNORMAL HIGH (ref 0.00–0.07)
BASOS PCT: 0 %
Basophils Absolute: 0 10*3/uL (ref 0.0–0.1)
EOS ABS: 0.1 10*3/uL (ref 0.0–0.5)
EOS PCT: 1 %
HCT: 27.3 % — ABNORMAL LOW (ref 39.0–52.0)
Hemoglobin: 8.6 g/dL — ABNORMAL LOW (ref 13.0–17.0)
Immature Granulocytes: 1 %
Lymphocytes Relative: 9 %
Lymphs Abs: 1.1 10*3/uL (ref 0.7–4.0)
MCH: 27.1 pg (ref 26.0–34.0)
MCHC: 31.5 g/dL (ref 30.0–36.0)
MCV: 86.1 fL (ref 80.0–100.0)
Monocytes Absolute: 1.4 10*3/uL — ABNORMAL HIGH (ref 0.1–1.0)
Monocytes Relative: 12 %
Neutro Abs: 9.3 10*3/uL — ABNORMAL HIGH (ref 1.7–7.7)
Neutrophils Relative %: 77 %
PLATELETS: 222 10*3/uL (ref 150–400)
RBC: 3.17 MIL/uL — AB (ref 4.22–5.81)
RDW: 12.6 % (ref 11.5–15.5)
WBC: 12.1 10*3/uL — AB (ref 4.0–10.5)
nRBC: 0.2 % (ref 0.0–0.2)

## 2018-03-04 LAB — BPAM RBC
BLOOD PRODUCT EXPIRATION DATE: 201911192359
Blood Product Expiration Date: 201911162359
Blood Product Expiration Date: 201911272359
Blood Product Expiration Date: 201911272359
ISSUE DATE / TIME: 201910300826
ISSUE DATE / TIME: 201911010748
ISSUE DATE / TIME: 201911011205
ISSUE DATE / TIME: 201911010748
UNIT TYPE AND RH: 6200
Unit Type and Rh: 6200
Unit Type and Rh: 9500
Unit Type and Rh: 9500

## 2018-03-04 LAB — TYPE AND SCREEN
ABO/RH(D): A POS
Antibody Screen: NEGATIVE
UNIT DIVISION: 0
Unit division: 0
Unit division: 0
Unit division: 0

## 2018-03-04 LAB — BASIC METABOLIC PANEL
Anion gap: 8 (ref 5–15)
BUN: 13 mg/dL (ref 6–20)
CALCIUM: 8.2 mg/dL — AB (ref 8.9–10.3)
CO2: 26 mmol/L (ref 22–32)
CREATININE: 0.72 mg/dL (ref 0.61–1.24)
Chloride: 99 mmol/L (ref 98–111)
GFR calc Af Amer: >60 mL/min (ref >60)
Glucose, Bld: 124 mg/dL — ABNORMAL HIGH (ref 70–99)
Potassium: 4 mmol/L (ref 3.5–5.1)
SODIUM: 133 mmol/L — AB (ref 135–145)

## 2018-03-04 MED ORDER — FENTANYL CITRATE (PF) 100 MCG/2ML IJ SOLN
200.0000 ug | Freq: Once | INTRAMUSCULAR | Status: AC
  Administered 2018-03-04: 200 ug via INTRAVENOUS

## 2018-03-04 MED ORDER — ENOXAPARIN SODIUM 30 MG/0.3ML ~~LOC~~ SOLN
30.0000 mg | Freq: Two times a day (BID) | SUBCUTANEOUS | Status: DC
  Administered 2018-03-04 – 2018-03-07 (×7): 30 mg via SUBCUTANEOUS
  Filled 2018-03-04 (×7): qty 0.3

## 2018-03-04 MED ORDER — CHLORHEXIDINE GLUCONATE 0.12 % MT SOLN
OROMUCOSAL | Status: AC
  Filled 2018-03-04: qty 15

## 2018-03-04 MED ORDER — KETOROLAC TROMETHAMINE 15 MG/ML IJ SOLN
15.0000 mg | Freq: Four times a day (QID) | INTRAMUSCULAR | Status: AC | PRN
  Administered 2018-03-04 – 2018-03-09 (×11): 15 mg via INTRAVENOUS
  Filled 2018-03-04 (×11): qty 1
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
## 2018-03-04 NOTE — Progress Notes (Signed)
Dr. Dwain Sarna notified of no output from the NG after clamped for 6 hours, will pull the NG and monitor for abdominal discomfort.  Also made him aware of wound vac not functioning properly anymore and constantly alarming, verbal orders to change the dressing if possible or to put a wet to dry dressing if not able to and WOC will change tomorrow per schedule.

## 2018-03-04 NOTE — Progress Notes (Signed)
Pt was in an incredible amount of pain during the replacement of wound vac, Dr. Dwain Sarna notified and a one time dose of fentanyl just for the procedure was given.  Pt now resting comfortably and in considerably less pain than during the replacement.

## 2018-03-04 NOTE — Progress Notes (Signed)
NG tube clamped from suction per order.  Will monitor for nausea/ vomiting for 6 hours per order.

## 2018-03-04 NOTE — Progress Notes (Signed)
NG tube suction turned on after 6 hours with no nausea/vomiting, 0 mL output.  Will report off to night shift.

## 2018-03-04 NOTE — Progress Notes (Signed)
Orthopedic Tech Progress Note Patient Details:  Richard E Campanaro Jr. 11/08/1995 [id number]  Ortho Devices Type of Ortho Device: Prafo boot/shoe Ortho Device/Splint Interventions: Application   Post Interventions Patient Tolerated: Well Instructions Provided: Care of device   Saul Fordyce 03/04/2018, 11:40 AM

## 2018-03-04 NOTE — Progress Notes (Signed)
60 mg diluadid wasted in sharps with My K RN

## 2018-03-05 LAB — CBC
HEMATOCRIT: 26.4 % — AB (ref 39.0–52.0)
Hemoglobin: 8.8 g/dL — ABNORMAL LOW (ref 13.0–17.0)
MCH: 28.5 pg (ref 26.0–34.0)
MCHC: 33.3 g/dL (ref 30.0–36.0)
MCV: 85.4 fL (ref 80.0–100.0)
PLATELETS: 254 10*3/uL (ref 150–400)
RBC: 3.09 MIL/uL — ABNORMAL LOW (ref 4.22–5.81)
RDW: 12.6 % (ref 11.5–15.5)
WBC: 15.3 10*3/uL — AB (ref 4.0–10.5)
nRBC: 0 % (ref 0.0–0.2)

## 2018-03-05 LAB — BASIC METABOLIC PANEL
Anion gap: 7 (ref 5–15)
BUN: 19 mg/dL (ref 6–20)
CALCIUM: 8.4 mg/dL — AB (ref 8.9–10.3)
CO2: 28 mmol/L (ref 22–32)
CREATININE: 0.89 mg/dL (ref 0.61–1.24)
Chloride: 100 mmol/L (ref 98–111)
GFR calc Af Amer: >60 mL/min (ref >60)
GFR calc non Af Amer: >60 mL/min (ref >60)
Glucose, Bld: 126 mg/dL — ABNORMAL HIGH (ref 70–99)
Potassium: 4.1 mmol/L (ref 3.5–5.1)
SODIUM: 135 mmol/L (ref 135–145)

## 2018-03-05 MED ORDER — CHLORHEXIDINE GLUCONATE 0.12 % MT SOLN
OROMUCOSAL | Status: AC
  Administered 2018-03-05: 15 mL via OROMUCOSAL
  Filled 2018-03-05: qty 15

## 2018-03-05 MED ORDER — METOPROLOL TARTRATE 12.5 MG HALF TABLET
12.5000 mg | ORAL_TABLET | Freq: Two times a day (BID) | ORAL | Status: DC
  Administered 2018-03-05 – 2018-03-06 (×3): 12.5 mg via ORAL
  Filled 2018-03-05 (×3): qty 1

## 2018-03-05 MED ORDER — BOOST / RESOURCE BREEZE PO LIQD CUSTOM
1.0000 | Freq: Three times a day (TID) | ORAL | Status: DC
  Administered 2018-03-05 – 2018-03-07 (×6): 1 via ORAL

## 2018-03-05 MED ORDER — OXYCODONE HCL 5 MG PO TABS
5.0000 mg | ORAL_TABLET | ORAL | Status: DC | PRN
  Administered 2018-03-05 – 2018-03-07 (×6): 10 mg via ORAL
  Filled 2018-03-05 (×6): qty 2

## 2018-03-05 MED ORDER — METHOCARBAMOL 1000 MG/10ML IJ SOLN
1000.0000 mg | Freq: Three times a day (TID) | INTRAVENOUS | Status: DC
  Administered 2018-03-05 – 2018-03-16 (×32): 1000 mg via INTRAVENOUS
  Filled 2018-03-05 (×36): qty 10

## 2018-03-05 MED ORDER — ADULT MULTIVITAMIN W/MINERALS CH
1.0000 | ORAL_TABLET | Freq: Every day | ORAL | Status: DC
  Administered 2018-03-05 – 2018-03-07 (×3): 1 via ORAL
  Filled 2018-03-05 (×3): qty 1

## 2018-03-05 MED ORDER — ACETAMINOPHEN 160 MG/5ML PO SOLN
650.0000 mg | Freq: Four times a day (QID) | ORAL | Status: DC | PRN
  Administered 2018-03-05: 650 mg via ORAL
  Filled 2018-03-05: qty 20.3

## 2018-03-05 MED ORDER — ACETAMINOPHEN 160 MG/5ML PO SOLN
650.0000 mg | Freq: Four times a day (QID) | ORAL | Status: DC
  Administered 2018-03-05 – 2018-03-06 (×3): 650 mg via ORAL
  Filled 2018-03-05 (×3): qty 20.3

## 2018-03-05 NOTE — Progress Notes (Signed)
Pt has continued to have pain control problems with his stated pain level never falling below a 5 even with all pain medicine being given when due.
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
## 2018-03-05 NOTE — Progress Notes (Signed)
Chaplain responded to spiritual care consult.  Pt and family were interested in completing an Advanced Directive. Pt was asleep but chaplain was able to discuss the AD document with his parents.  Chaplain left AD with family and requested they contact her when they are ready to execute it. Will be available for other pt's needs as well. Lynnell Alfonso Pager [phone]
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
## 2018-03-05 NOTE — Consult Note (Signed)
WOC follow-up: Vac machine was not functioning properly yesterday, according to the bedside nurse when discussed on the phone this am.  They state they changed the dressing and it is currently intact with a good seal at cont suction.  WOC will plan to change on Wed.     Ostomy pouch is intact with a good seal; surgical team unsnapped the pouch to assess stoma appearance earlier, according to the bedside nurse.  Stoma is black and covered with old clotted blood.  Refer to surgical team notes; it is functioning with small amt brown liquid stool. WOC will plan to change on Wed and perform teaching session at that time.  Supplies at the bedside for staff nurse use if leakage occurs.  Cammie Mcgee MSN, RN, CWOCN, [place], CNS [phone]
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
## 2018-03-06 ENCOUNTER — Inpatient Hospital Stay (HOSPITAL_COMMUNITY): Payer: BLUE CROSS/BLUE SHIELD

## 2018-03-06 LAB — URINALYSIS, ROUTINE W REFLEX MICROSCOPIC
Glucose, UA: NEGATIVE mg/dL
Hgb urine dipstick: NEGATIVE
Ketones, ur: NEGATIVE mg/dL
LEUKOCYTES UA: NEGATIVE
NITRITE: NEGATIVE
Protein, ur: 100 mg/dL — AB
SPECIFIC GRAVITY, URINE: 1.04 — AB (ref 1.005–1.030)
pH: 5 (ref 5.0–8.0)

## 2018-03-06 LAB — CBC
HCT: 25.2 % — ABNORMAL LOW (ref 39.0–52.0)
Hemoglobin: 8.4 g/dL — ABNORMAL LOW (ref 13.0–17.0)
MCH: 28.1 pg (ref 26.0–34.0)
MCHC: 33.3 g/dL (ref 30.0–36.0)
MCV: 84.3 fL (ref 80.0–100.0)
NRBC: 0.1 % (ref 0.0–0.2)
PLATELETS: 340 10*3/uL (ref 150–400)
RBC: 2.99 MIL/uL — ABNORMAL LOW (ref 4.22–5.81)
RDW: 12.9 % (ref 11.5–15.5)
WBC: 20.4 10*3/uL — AB (ref 4.0–10.5)

## 2018-03-06 MED ORDER — ACETAMINOPHEN 325 MG PO TABS
650.0000 mg | ORAL_TABLET | Freq: Four times a day (QID) | ORAL | Status: DC
  Administered 2018-03-06 – 2018-03-07 (×6): 650 mg via ORAL
  Filled 2018-03-06 (×6): qty 2

## 2018-03-06 MED ORDER — METOPROLOL TARTRATE 25 MG PO TABS
25.0000 mg | ORAL_TABLET | Freq: Two times a day (BID) | ORAL | Status: DC
  Administered 2018-03-06 – 2018-03-07 (×2): 25 mg via ORAL
  Filled 2018-03-06 (×3): qty 1
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
## 2018-03-06 NOTE — Progress Notes (Signed)
SBIRT completed.   Quintella Baton, RN, BSN  Trauma/Neuro ICU Case Manager [phone]

## 2018-03-06 NOTE — Progress Notes (Signed)
   03/06/18 0900  Clinical Encounter Type  Visited With Patient;Family  Visit Type Initial  Referral From Nurse  Consult/Referral To Chaplain  Spiritual Encounters  Spiritual Needs Emotional;Literature  Stress Factors  Patient Stress Factors Exhausted;Major life changes   Responded to spiritual care consult for an AD. PT was alert and family was at bedside. PT was talkative. I gave an overview of the AD, leaving him a copy for him to look over. I offered spiritual care with ministry of presence, words of encouragement and a listening ear. Chaplain available upon request.   Chaplain Orest Dikes [phone]

## 2018-03-06 NOTE — Progress Notes (Signed)
OT Cancellation Note  Patient Details Name: Richard E Thorpe Jr. MRN: [id number] DOB: Jul 28, 1995   Cancelled Treatment:    Reason Eval/Treat Not Completed: Patient at procedure or test/ unavailable(x-ray)  Thornell Mule, OT/L   Acute OT Clinical Specialist Acute Rehabilitation Services Pager [phone] Office [phone]  03/06/2018, 3:05 PM
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
## 2018-03-07 ENCOUNTER — Inpatient Hospital Stay (HOSPITAL_COMMUNITY): Payer: BLUE CROSS/BLUE SHIELD

## 2018-03-07 LAB — URINE CULTURE: CULTURE: NO GROWTH

## 2018-03-07 LAB — BASIC METABOLIC PANEL
Anion gap: 10 (ref 5–15)
BUN: 14 mg/dL (ref 6–20)
CALCIUM: 8.2 mg/dL — AB (ref 8.9–10.3)
CO2: 27 mmol/L (ref 22–32)
CREATININE: 0.78 mg/dL (ref 0.61–1.24)
Chloride: 97 mmol/L — ABNORMAL LOW (ref 98–111)
GFR calc non Af Amer: >60 mL/min (ref >60)
Glucose, Bld: 125 mg/dL — ABNORMAL HIGH (ref 70–99)
Potassium: 3.7 mmol/L (ref 3.5–5.1)
SODIUM: 134 mmol/L — AB (ref 135–145)

## 2018-03-07 LAB — CBC
HEMATOCRIT: 24 % — AB (ref 39.0–52.0)
HEMOGLOBIN: 7.8 g/dL — AB (ref 13.0–17.0)
MCH: 27.3 pg (ref 26.0–34.0)
MCHC: 32.5 g/dL (ref 30.0–36.0)
MCV: 83.9 fL (ref 80.0–100.0)
NRBC: 0.1 % (ref 0.0–0.2)
PLATELETS: 410 10*3/uL — AB (ref 150–400)
RBC: 2.86 MIL/uL — AB (ref 4.22–5.81)
RDW: 12.9 % (ref 11.5–15.5)
WBC: 23.7 10*3/uL — ABNORMAL HIGH (ref 4.0–10.5)

## 2018-03-07 MED ORDER — PIPERACILLIN-TAZOBACTAM 3.375 G IVPB
3.3750 g | Freq: Three times a day (TID) | INTRAVENOUS | Status: DC
  Administered 2018-03-07 – 2018-03-13 (×19): 3.375 g via INTRAVENOUS
  Filled 2018-03-07 (×20): qty 50

## 2018-03-07 MED ORDER — FENTANYL CITRATE (PF) 100 MCG/2ML IJ SOLN
50.0000 ug | INTRAMUSCULAR | Status: DC | PRN
  Administered 2018-03-07 – 2018-03-08 (×4): 100 ug via INTRAVENOUS
  Filled 2018-03-07 (×4): qty 2

## 2018-03-07 MED ORDER — IOHEXOL 300 MG/ML  SOLN
100.0000 mL | Freq: Once | INTRAMUSCULAR | Status: AC | PRN
  Administered 2018-03-07: 100 mL via INTRAVENOUS

## 2018-03-07 MED ORDER — PROMETHAZINE HCL 25 MG/ML IJ SOLN
25.0000 mg | INTRAMUSCULAR | Status: DC | PRN
  Administered 2018-03-08: 25 mg via INTRAVENOUS
  Filled 2018-03-07 (×2): qty 1

## 2018-03-07 MED ORDER — PIPERACILLIN-TAZOBACTAM 3.375 G IVPB 30 MIN
3.3750 g | Freq: Once | INTRAVENOUS | Status: AC
  Administered 2018-03-07: 3.375 g via INTRAVENOUS
  Filled 2018-03-07: qty 50

## 2018-03-07 MED ORDER — ENSURE ENLIVE PO LIQD
237.0000 mL | Freq: Three times a day (TID) | ORAL | Status: DC
  Administered 2018-03-07: 237 mL via ORAL

## 2018-03-07 MED ORDER — PRO-STAT SUGAR FREE PO LIQD
30.0000 mL | Freq: Every day | ORAL | Status: DC
  Administered 2018-03-07: 30 mL via ORAL
  Filled 2018-03-07: qty 30
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
## 2018-03-07 NOTE — Progress Notes (Signed)
IR aware of request for perc abscess drain. Chart, imaging reviewed with Dr. Bonnielee Haff. Pt has had diet and Lovenox earlier today. Have made NPO p MN and held further Lovenox. Plan for procedure tomorrow.  Brayton El PA-C Interventional Radiology 03/07/2018 4:36 PM

## 2018-03-07 NOTE — Consult Note (Addendum)
WOC Nurse wound consult note Reason for Consult: Vac change performed to midline full thickness post-op abd wound.  Surgical PA at bedside to assess wound appearance. Pt was medicated for pain prior to the procedure and tolerated with mod amt discomfort. Wound bed: beefy red; 19X4X3cm; depth is deeper at 12:00 o'clock and in the middle wound bed; swab inserted and clear fluid is draining to these sites with pinhole openings. Drainage (amount, consistency, odor) small amt bloody drainage when dressing was removed. Periwound: macerated skin surrounding where previous Vac sponge was over intact skin on wound edges Dressing procedure/placement/frequency: Applied one piece black foam to cont suction, avoiding skin edges surrounding. WOC will plan to perform pouch change Q M/W/F since the ostomy pouch is located in close proximity. PA states patient will not meed the Vac when discharged home, since wound is becoming shallow to lower areas.  WOC Nurse ostomy consult note Stoma type/location: Colostomy surgery was performed 10/30.  Family members at the bedside watched the process and asked appropriate questions. Stomal assessment/size: Stoma is dusky red and covered with clotted blood, 1 3/4 inches, above skin level; appearance assessed by PA. Peristomal assessment:  Intact skin surrounding Output: Small amt bloody drainage; small amt brown liquid stool Ostomy pouching: 1 pc.  Education provided: Demonstrated pouch change and discussed pouching routines and ordering supplies. Girlfriend was able to open and close pouch without assistance and mother states she has experience with pouching activities since her mother had one in the past.  Pt did not participate in application or emptying, despite encouragement. Enrolled patient in [employer] DC program: Yes Supplies at the bedside for staff nurse use and educational materials in the room. WOC team will continue to follow for further  teaching sessions. Cammie Mcgee MSN, RN, CWOCN, [place], CNS [phone]

## 2018-03-08 ENCOUNTER — Inpatient Hospital Stay (HOSPITAL_COMMUNITY): Payer: BLUE CROSS/BLUE SHIELD

## 2018-03-08 LAB — CBC
HCT: 26.5 % — ABNORMAL LOW (ref 39.0–52.0)
Hemoglobin: 8.5 g/dL — ABNORMAL LOW (ref 13.0–17.0)
MCH: 27.2 pg (ref 26.0–34.0)
MCHC: 32.1 g/dL (ref 30.0–36.0)
MCV: 84.9 fL (ref 80.0–100.0)
NRBC: 0.1 % (ref 0.0–0.2)
Platelets: 532 10*3/uL — ABNORMAL HIGH (ref 150–400)
RBC: 3.12 MIL/uL — AB (ref 4.22–5.81)
RDW: 13.2 % (ref 11.5–15.5)
WBC: 32 10*3/uL — AB (ref 4.0–10.5)

## 2018-03-08 LAB — BASIC METABOLIC PANEL
Anion gap: 13 (ref 5–15)
BUN: 16 mg/dL (ref 6–20)
CALCIUM: 8.3 mg/dL — AB (ref 8.9–10.3)
CO2: 25 mmol/L (ref 22–32)
CREATININE: 0.89 mg/dL (ref 0.61–1.24)
Chloride: 94 mmol/L — ABNORMAL LOW (ref 98–111)
GFR calc non Af Amer: >60 mL/min (ref >60)
Glucose, Bld: 121 mg/dL — ABNORMAL HIGH (ref 70–99)
Potassium: 4 mmol/L (ref 3.5–5.1)
SODIUM: 132 mmol/L — AB (ref 135–145)

## 2018-03-08 MED ORDER — LIDOCAINE HCL 1 % IJ SOLN
INTRAMUSCULAR | Status: AC
  Administered 2018-03-08: 08:00:00
  Filled 2018-03-08: qty 20

## 2018-03-08 MED ORDER — METOPROLOL TARTRATE 5 MG/5ML IV SOLN
10.0000 mg | Freq: Two times a day (BID) | INTRAVENOUS | Status: DC
  Administered 2018-03-08 – 2018-03-14 (×14): 10 mg via INTRAVENOUS
  Filled 2018-03-08 (×14): qty 10

## 2018-03-08 MED ORDER — FAMOTIDINE IN NACL 20-0.9 MG/50ML-% IV SOLN
20.0000 mg | Freq: Two times a day (BID) | INTRAVENOUS | Status: DC
  Administered 2018-03-08 – 2018-03-09 (×4): 20 mg via INTRAVENOUS
  Filled 2018-03-08 (×5): qty 50

## 2018-03-08 MED ORDER — MORPHINE SULFATE (PF) 2 MG/ML IV SOLN
1.0000 mg | INTRAVENOUS | Status: DC | PRN
  Administered 2018-03-08 – 2018-03-09 (×5): 1 mg via INTRAVENOUS
  Filled 2018-03-08 (×5): qty 1

## 2018-03-08 MED ORDER — LIDOCAINE HCL 1 % IJ SOLN
INTRAMUSCULAR | Status: AC
  Administered 2018-03-08: 11:00:00
  Filled 2018-03-08: qty 20

## 2018-03-08 MED ORDER — SODIUM CHLORIDE 0.9 % IV SOLN
INTRAVENOUS | Status: DC
  Administered 2018-03-08: 12:00:00 via INTRAVENOUS
  Filled 2018-03-08 (×3): qty 1000

## 2018-03-08 MED ORDER — SODIUM CHLORIDE 0.9% FLUSH
5.0000 mL | Freq: Three times a day (TID) | INTRAVENOUS | Status: DC
  Administered 2018-03-08 – 2018-03-12 (×12): 5 mL

## 2018-03-08 MED ORDER — FENTANYL CITRATE (PF) 100 MCG/2ML IJ SOLN
INTRAMUSCULAR | Status: AC | PRN
  Administered 2018-03-08 (×2): 25 ug via INTRAVENOUS
  Administered 2018-03-08 (×3): 50 ug via INTRAVENOUS

## 2018-03-08 MED ORDER — MIDAZOLAM HCL 2 MG/2ML IJ SOLN
INTRAMUSCULAR | Status: AC | PRN
  Administered 2018-03-08 (×4): 1 mg via INTRAVENOUS

## 2018-03-08 MED ORDER — FENTANYL CITRATE (PF) 100 MCG/2ML IJ SOLN
INTRAMUSCULAR | Status: AC
  Administered 2018-03-08: 10:00:00
  Filled 2018-03-08: qty 6

## 2018-03-08 MED ORDER — MIDAZOLAM HCL 2 MG/2ML IJ SOLN
INTRAMUSCULAR | Status: AC
  Administered 2018-03-08: 10:00:00
  Filled 2018-03-08: qty 6

## 2018-03-08 NOTE — Progress Notes (Signed)
OT Cancellation Note  Patient Details Name: [name] E Carico Jr. MRN: [id number] DOB: [birth year]   Cancelled Treatment:    Reason Eval/Treat Not Completed: Patient at procedure or test/ unavailable  [facility]  Luisa Dago, OT/L   Acute OT Clinical Specialist Acute Rehabilitation Services Pager [phone] Office [phone]  03/08/2018, 1:18 PM

## 2018-03-08 NOTE — Progress Notes (Signed)
Pt was sat up as much as possible and procedure was explained. 16 NG was inserted through the left nare. Pt vomited one time during placement x 1. Pt tolerated fair. X ray ordered to check placement and was determined to be in the stomach. [employer] RN 03/08/2018.
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
## 2018-03-08 NOTE — Progress Notes (Signed)
Patient was nauseous earlier last night. Dr. Corliss Skains was called and he gave verbal orders to give Phenergan. This medication and Zofran was unable to relieve nausousness. Patient abdomen was distended and he vomitted. I called Dr.Tsuei again around 0500 to suggest NG suction.  Dr. Lenna Gilford intermittent suction to lower stomach.  200 cc of green emesis was extracted so far.  Maeola Sarah, RN 03/08/18 [phone]
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
## 2018-03-09 ENCOUNTER — Inpatient Hospital Stay: Payer: Self-pay

## 2018-03-09 LAB — CBC
HCT: 23.8 % — ABNORMAL LOW (ref 39.0–52.0)
HEMOGLOBIN: 7.8 g/dL — AB (ref 13.0–17.0)
MCH: 27.8 pg (ref 26.0–34.0)
MCHC: 32.8 g/dL (ref 30.0–36.0)
MCV: 84.7 fL (ref 80.0–100.0)
Platelets: 556 10*3/uL — ABNORMAL HIGH (ref 150–400)
RBC: 2.81 MIL/uL — ABNORMAL LOW (ref 4.22–5.81)
RDW: 13.3 % (ref 11.5–15.5)
WBC: 24.2 10*3/uL — AB (ref 4.0–10.5)
nRBC: 0.1 % (ref 0.0–0.2)

## 2018-03-09 LAB — BASIC METABOLIC PANEL
Anion gap: 12 (ref 5–15)
BUN: 25 mg/dL — ABNORMAL HIGH (ref 6–20)
CHLORIDE: 97 mmol/L — AB (ref 98–111)
CO2: 24 mmol/L (ref 22–32)
Calcium: 8 mg/dL — ABNORMAL LOW (ref 8.9–10.3)
Creatinine, Ser: 0.99 mg/dL (ref 0.61–1.24)
GFR calc non Af Amer: >60 mL/min (ref >60)
Glucose, Bld: 109 mg/dL — ABNORMAL HIGH (ref 70–99)
POTASSIUM: 3.6 mmol/L (ref 3.5–5.1)
SODIUM: 133 mmol/L — AB (ref 135–145)

## 2018-03-09 MED ORDER — INSULIN ASPART 100 UNIT/ML ~~LOC~~ SOLN
0.0000 [IU] | Freq: Three times a day (TID) | SUBCUTANEOUS | Status: DC
  Administered 2018-03-09 – 2018-03-11 (×6): 1 [IU] via SUBCUTANEOUS
  Administered 2018-03-13: 7 [IU] via SUBCUTANEOUS

## 2018-03-09 MED ORDER — SODIUM CHLORIDE 0.9% FLUSH: 10.0000 mL | INTRAVENOUS | Status: DC | PRN

## 2018-03-09 MED ORDER — KCL IN DEXTROSE-NACL 20-5-0.9 MEQ/L-%-% IV SOLN
INTRAVENOUS | Status: AC
  Administered 2018-03-09: 14:00:00 via INTRAVENOUS
  Filled 2018-03-09: qty 1000

## 2018-03-09 MED ORDER — TRAVASOL 10 % IV SOLN
INTRAVENOUS | Status: AC
  Administered 2018-03-09: 18:00:00 via INTRAVENOUS
  Filled 2018-03-09: qty 566.4

## 2018-03-09 MED ORDER — MORPHINE SULFATE (PF) 2 MG/ML IV SOLN
2.0000 mg | INTRAVENOUS | Status: DC | PRN
  Administered 2018-03-09 – 2018-03-10 (×6): 2 mg via INTRAVENOUS
  Administered 2018-03-10 (×2): 3 mg via INTRAVENOUS
  Administered 2018-03-10 (×4): 2 mg via INTRAVENOUS
  Administered 2018-03-11 (×3): 3 mg via INTRAVENOUS
  Administered 2018-03-11 (×3): 2 mg via INTRAVENOUS
  Administered 2018-03-11: 4 mg via INTRAVENOUS
  Administered 2018-03-11 (×2): 2 mg via INTRAVENOUS
  Administered 2018-03-12: 4 mg via INTRAVENOUS
  Administered 2018-03-12: 2 mg via INTRAVENOUS
  Administered 2018-03-12: 4 mg via INTRAVENOUS
  Administered 2018-03-12: 2 mg via INTRAVENOUS
  Administered 2018-03-12: 3 mg via INTRAVENOUS
  Filled 2018-03-09 (×2): qty 2
  Filled 2018-03-09 (×2): qty 1
  Filled 2018-03-09: qty 2
  Filled 2018-03-09: qty 1
  Filled 2018-03-09: qty 2
  Filled 2018-03-09: qty 1
  Filled 2018-03-09: qty 2
  Filled 2018-03-09: qty 1
  Filled 2018-03-09: qty 2
  Filled 2018-03-09: qty 1
  Filled 2018-03-09: qty 2
  Filled 2018-03-09 (×4): qty 1
  Filled 2018-03-09: qty 2
  Filled 2018-03-09 (×2): qty 1
  Filled 2018-03-09: qty 2
  Filled 2018-03-09 (×6): qty 1

## 2018-03-09 MED ORDER — SODIUM CHLORIDE 0.9% FLUSH
10.0000 mL | Freq: Two times a day (BID) | INTRAVENOUS | Status: DC
  Administered 2018-03-10 – 2018-03-12 (×3): 10 mL
  Administered 2018-03-12: 20 mL

## 2018-03-09 NOTE — Progress Notes (Signed)
Completed and signed FMLA forms returned to pt's father.  Quintella Baton, RN, BSN  Trauma/Neuro ICU Case Manager [phone]
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
## 2018-03-09 NOTE — Progress Notes (Signed)
Peripherally Inserted Central Catheter/Midline Placement  The IV Nurse has discussed with the patient and/or persons authorized to consent for the patient, the purpose of this procedure and the potential benefits and risks involved with this procedure.  The benefits include less needle sticks, lab draws from the catheter, and the patient may be discharged home with the catheter. Risks include, but not limited to, infection, bleeding, blood clot (thrombus formation), and puncture of an artery; nerve damage and irregular heartbeat and possibility to perform a PICC exchange if needed/ordered by physician.  Alternatives to this procedure were also discussed.  Bard Power PICC patient education guide, fact sheet on infection prevention and patient information card has been provided to patient /or left at bedside.    PICC/Midline Placement Documentation  PICC Double Lumen 03/09/18 PICC Right Basilic 41 cm 0 cm (Active)  Indication for Insertion or Continuance of Line Administration of hyperosmolar/irritating solutions (i.e. TPN, Vancomycin, etc.) 03/09/2018  3:16 PM  Exposed Catheter (cm) 0 cm 03/09/2018  3:16 PM  Site Assessment Clean;Dry;Intact 03/09/2018  3:16 PM  Lumen #1 Status Flushed;Saline locked;Blood return noted 03/09/2018  3:16 PM  Lumen #2 Status Flushed;Saline locked;Blood return noted 03/09/2018  3:16 PM  Dressing Type Transparent;Securing device 03/09/2018  3:16 PM  Dressing Status Clean;Dry;Intact;Antimicrobial disc in place 03/09/2018  3:16 PM  Dressing Change Due 03/16/18 03/09/2018  3:16 PM       Romie Jumper 03/09/2018, 3:19 PM

## 2018-03-10 DIAGNOSIS — S36893A Laceration of other intra-abdominal organs, initial encounter: Secondary | ICD-10-CM

## 2018-03-10 DIAGNOSIS — S72351A Displaced comminuted fracture of shaft of right femur, initial encounter for closed fracture: Secondary | ICD-10-CM

## 2018-03-10 DIAGNOSIS — S36508A Unspecified injury of other part of colon, initial encounter: Secondary | ICD-10-CM

## 2018-03-10 LAB — COMPREHENSIVE METABOLIC PANEL
ALBUMIN: 2 g/dL — AB (ref 3.5–5.0)
ALT: 72 U/L — ABNORMAL HIGH (ref 0–44)
AST: 58 U/L — AB (ref 15–41)
Alkaline Phosphatase: 148 U/L — ABNORMAL HIGH (ref 38–126)
Anion gap: 7 (ref 5–15)
BUN: 15 mg/dL (ref 6–20)
CALCIUM: 7.7 mg/dL — AB (ref 8.9–10.3)
CHLORIDE: 104 mmol/L (ref 98–111)
CO2: 22 mmol/L (ref 22–32)
Creatinine, Ser: 0.86 mg/dL (ref 0.61–1.24)
GFR calc Af Amer: >60 mL/min (ref >60)
Glucose, Bld: 122 mg/dL — ABNORMAL HIGH (ref 70–99)
POTASSIUM: 3.5 mmol/L (ref 3.5–5.1)
SODIUM: 133 mmol/L — AB (ref 135–145)
TOTAL PROTEIN: 6.4 g/dL — AB (ref 6.5–8.1)
Total Bilirubin: 0.9 mg/dL (ref 0.3–1.2)

## 2018-03-10 LAB — CBC
HCT: 26.4 % — ABNORMAL LOW (ref 39.0–52.0)
HEMATOCRIT: 22.7 % — AB (ref 39.0–52.0)
Hemoglobin: 7 g/dL — ABNORMAL LOW (ref 13.0–17.0)
Hemoglobin: 8.4 g/dL — ABNORMAL LOW (ref 13.0–17.0)
MCH: 26.7 pg (ref 26.0–34.0)
MCH: 28 pg (ref 26.0–34.0)
MCHC: 30.8 g/dL (ref 30.0–36.0)
MCHC: 31.8 g/dL (ref 30.0–36.0)
MCV: 86.6 fL (ref 80.0–100.0)
MCV: 88 fL (ref 80.0–100.0)
NRBC: 0.1 % (ref 0.0–0.2)
PLATELETS: 594 10*3/uL — AB (ref 150–400)
Platelets: 645 10*3/uL — ABNORMAL HIGH (ref 150–400)
RBC: 2.62 MIL/uL — AB (ref 4.22–5.81)
RBC: 3 MIL/uL — ABNORMAL LOW (ref 4.22–5.81)
RDW: 13.6 % (ref 11.5–15.5)
RDW: 13.5 % (ref 11.5–15.5)
WBC: 20.2 10*3/uL — ABNORMAL HIGH (ref 4.0–10.5)
WBC: 19.6 10*3/uL — ABNORMAL HIGH (ref 4.0–10.5)
nRBC: 0.2 % (ref 0.0–0.2)

## 2018-03-10 LAB — PREPARE RBC (CROSSMATCH)

## 2018-03-10 LAB — TRIGLYCERIDES: Triglycerides: 112 mg/dL (ref <150)

## 2018-03-10 LAB — DIFFERENTIAL
Abs Immature Granulocytes: 1.02 10*3/uL — ABNORMAL HIGH (ref 0.00–0.07)
Basophils Absolute: 0.1 10*3/uL (ref 0.0–0.1)
Basophils Relative: 0 %
Eosinophils Absolute: 0.1 10*3/uL (ref 0.0–0.5)
Eosinophils Relative: 0 %
IMMATURE GRANULOCYTES: 5 %
LYMPHS PCT: 11 %
Lymphs Abs: 2.1 10*3/uL (ref 0.7–4.0)
MONO ABS: 2.6 10*3/uL — AB (ref 0.1–1.0)
MONOS PCT: 13 %
NEUTROS ABS: 14.3 10*3/uL — AB (ref 1.7–7.7)
Neutrophils Relative %: 71 %
WBC MORPHOLOGY: INCREASED

## 2018-03-10 LAB — PHOSPHORUS: PHOSPHORUS: 3.1 mg/dL (ref 2.5–4.6)

## 2018-03-10 LAB — PREALBUMIN: Prealbumin: 8.1 mg/dL — ABNORMAL LOW (ref 18–38)

## 2018-03-10 LAB — GLUCOSE, CAPILLARY: Glucose-Capillary: 109 mg/dL — ABNORMAL HIGH (ref 70–99)

## 2018-03-10 LAB — MAGNESIUM: MAGNESIUM: 2.1 mg/dL (ref 1.7–2.4)

## 2018-03-10 MED ORDER — ACETAMINOPHEN 10 MG/ML IV SOLN
1000.0000 mg | Freq: Four times a day (QID) | INTRAVENOUS | Status: AC
  Administered 2018-03-10 – 2018-03-11 (×4): 1000 mg via INTRAVENOUS
  Filled 2018-03-10 (×4): qty 100

## 2018-03-10 MED ORDER — TRAVASOL 10 % IV SOLN
INTRAVENOUS | Status: AC
  Administered 2018-03-10: 17:00:00 via INTRAVENOUS
  Filled 2018-03-10: qty 991.2

## 2018-03-10 MED ORDER — BACITRACIN ZINC 500 UNIT/GM EX OINT
TOPICAL_OINTMENT | Freq: Two times a day (BID) | CUTANEOUS | Status: DC
  Administered 2018-03-10: 1 via TOPICAL
  Administered 2018-03-11 – 2018-03-13 (×5): via TOPICAL
  Administered 2018-03-14: 1 via TOPICAL
  Administered 2018-03-14 – 2018-03-20 (×11): via TOPICAL
  Filled 2018-03-10 (×2): qty 28.4

## 2018-03-10 MED ORDER — CHLORHEXIDINE GLUCONATE CLOTH 2 % EX PADS
6.0000 | MEDICATED_PAD | Freq: Every day | CUTANEOUS | Status: DC
  Administered 2018-03-10 – 2018-03-29 (×18): 6 via TOPICAL

## 2018-03-10 MED ORDER — SODIUM CHLORIDE 0.9% IV SOLUTION
Freq: Once | INTRAVENOUS | Status: AC
  Administered 2018-03-10: 11:00:00 via INTRAVENOUS

## 2018-03-10 NOTE — Progress Notes (Signed)
0025: Paged on-call provider about temp of 102.9. Awaiting call back.   4: On-call provider still has not responded. Re-checked temp and down to 99.3. Will continue to monitor.  0135: Temp re-checked 98.7.  Pt maintained normal temp throughout rest of shift.
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
## 2018-03-11 LAB — GLUCOSE, CAPILLARY
GLUCOSE-CAPILLARY: 121 mg/dL — AB (ref 70–99)
GLUCOSE-CAPILLARY: 140 mg/dL — AB (ref 70–99)
GLUCOSE-CAPILLARY: 142 mg/dL — AB (ref 70–99)
Glucose-Capillary: 139 mg/dL — ABNORMAL HIGH (ref 70–99)
Glucose-Capillary: 130 mg/dL — ABNORMAL HIGH (ref 70–99)
Glucose-Capillary: 109 mg/dL — ABNORMAL HIGH (ref 70–99)

## 2018-03-11 LAB — CULTURE, BLOOD (ROUTINE X 2)
CULTURE: NO GROWTH
Culture: NO GROWTH
SPECIAL REQUESTS: ADEQUATE
SPECIAL REQUESTS: ADEQUATE

## 2018-03-11 MED ORDER — TRAVASOL 10 % IV SOLN
INTRAVENOUS | Status: AC
  Administered 2018-03-11: 17:00:00 via INTRAVENOUS
  Filled 2018-03-11: qty 1345.2

## 2018-03-11 MED ORDER — MENTHOL 3 MG MT LOZG
1.0000 | LOZENGE | OROMUCOSAL | Status: DC | PRN
  Filled 2018-03-11: qty 9
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
## 2018-03-11 NOTE — Progress Notes (Signed)
4098: Changed pt's midline. The existing tunnel is much larger than previous. Measured at 1.7 cm in diameter and 3 cm in depth. There appears to be tan slough looking material in wound bed. Second tunnel is above previous. Unable to fit applicator in new tunnel. Looks like third tunnel is beginning between first two. Minimal drainage on dressing. Throughout shift, both LT and RT drains only had an output of 5 mL each. NG tube 75 mL. Ileostomy drainage now seems to have a blood tinge to brown liquid on second emptying. Increased odor noted.
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
## 2018-03-12 ENCOUNTER — Inpatient Hospital Stay (HOSPITAL_COMMUNITY): Payer: BLUE CROSS/BLUE SHIELD | Admitting: Registered Nurse

## 2018-03-12 ENCOUNTER — Encounter (HOSPITAL_COMMUNITY): Admission: EM | Disposition: A | Discharge status: Home Health | Payer: Self-pay | Source: Home / Self Care

## 2018-03-12 ENCOUNTER — Inpatient Hospital Stay (HOSPITAL_COMMUNITY): Payer: BLUE CROSS/BLUE SHIELD

## 2018-03-12 ENCOUNTER — Encounter (HOSPITAL_COMMUNITY): Payer: Self-pay | Admitting: Radiology

## 2018-03-12 HISTORY — PX: LAPAROTOMY: SHX154

## 2018-03-12 LAB — CBC WITH DIFFERENTIAL/PLATELET
ABS IMMATURE GRANULOCYTES: 4.96 10*3/uL — AB (ref 0.00–0.07)
BASOS ABS: 0.1 10*3/uL (ref 0.0–0.1)
Basophils Relative: 0 %
EOS ABS: 0.2 10*3/uL (ref 0.0–0.5)
Eosinophils Relative: 0 %
HEMATOCRIT: 33 % — AB (ref 39.0–52.0)
Hemoglobin: 10.7 g/dL — ABNORMAL LOW (ref 13.0–17.0)
IMMATURE GRANULOCYTES: 13 %
LYMPHS ABS: 1.9 10*3/uL (ref 0.7–4.0)
Lymphocytes Relative: 5 %
MCH: 27.9 pg (ref 26.0–34.0)
MCHC: 32.4 g/dL (ref 30.0–36.0)
MCV: 85.9 fL (ref 80.0–100.0)
MONOS PCT: 6 %
Monocytes Absolute: 2.2 10*3/uL — ABNORMAL HIGH (ref 0.1–1.0)
NEUTROS PCT: 76 %
NRBC: 0.1 % (ref 0.0–0.2)
Neutro Abs: 29.7 10*3/uL — ABNORMAL HIGH (ref 1.7–7.7)
Platelets: 863 10*3/uL — ABNORMAL HIGH (ref 150–400)
RBC: 3.84 MIL/uL — ABNORMAL LOW (ref 4.22–5.81)
RDW: 13.2 % (ref 11.5–15.5)
WBC: 39 10*3/uL — ABNORMAL HIGH (ref 4.0–10.5)

## 2018-03-12 LAB — COMPREHENSIVE METABOLIC PANEL
ALT: 99 U/L — ABNORMAL HIGH (ref 0–44)
ANION GAP: 9 (ref 5–15)
AST: 58 U/L — ABNORMAL HIGH (ref 15–41)
Albumin: 2.1 g/dL — ABNORMAL LOW (ref 3.5–5.0)
Alkaline Phosphatase: 214 U/L — ABNORMAL HIGH (ref 38–126)
BUN: 16 mg/dL (ref 6–20)
CHLORIDE: 103 mmol/L (ref 98–111)
CO2: 20 mmol/L — AB (ref 22–32)
Calcium: 8.3 mg/dL — ABNORMAL LOW (ref 8.9–10.3)
Creatinine, Ser: 0.69 mg/dL (ref 0.61–1.24)
Glucose, Bld: 144 mg/dL — ABNORMAL HIGH (ref 70–99)
POTASSIUM: 4.2 mmol/L (ref 3.5–5.1)
SODIUM: 132 mmol/L — AB (ref 135–145)
Total Bilirubin: 0.8 mg/dL (ref 0.3–1.2)
Total Protein: 7 g/dL (ref 6.5–8.1)

## 2018-03-12 LAB — PREPARE RBC (CROSSMATCH)

## 2018-03-12 LAB — BPAM RBC
Blood Product Expiration Date: 201912012359
ISSUE DATE / TIME: 201911091040
Unit Type and Rh: 6200

## 2018-03-12 LAB — CBC
HCT: 26.5 % — ABNORMAL LOW (ref 39.0–52.0)
HEMOGLOBIN: 8.7 g/dL — AB (ref 13.0–17.0)
MCH: 27.7 pg (ref 26.0–34.0)
MCHC: 32.8 g/dL (ref 30.0–36.0)
MCV: 84.4 fL (ref 80.0–100.0)
PLATELETS: 687 10*3/uL — AB (ref 150–400)
RBC: 3.14 MIL/uL — AB (ref 4.22–5.81)
RDW: 13.4 % (ref 11.5–15.5)
WBC: 19.2 10*3/uL — AB (ref 4.0–10.5)
nRBC: 0.2 % (ref 0.0–0.2)

## 2018-03-12 LAB — BASIC METABOLIC PANEL
Anion gap: 10 (ref 5–15)
BUN: 18 mg/dL (ref 6–20)
CALCIUM: 8.2 mg/dL — AB (ref 8.9–10.3)
CO2: 19 mmol/L — AB (ref 22–32)
Chloride: 103 mmol/L (ref 98–111)
Creatinine, Ser: 0.77 mg/dL (ref 0.61–1.24)
GFR calc non Af Amer: >60 mL/min (ref >60)
GLUCOSE: 226 mg/dL — AB (ref 70–99)
Potassium: 4.9 mmol/L (ref 3.5–5.1)
Sodium: 132 mmol/L — ABNORMAL LOW (ref 135–145)

## 2018-03-12 LAB — POCT I-STAT 4, (NA,K, GLUC, HGB,HCT)
Glucose, Bld: 189 mg/dL — ABNORMAL HIGH (ref 70–99)
HEMATOCRIT: 31 % — AB (ref 39.0–52.0)
Hemoglobin: 10.5 g/dL — ABNORMAL LOW (ref 13.0–17.0)
Potassium: 5.4 mmol/L — ABNORMAL HIGH (ref 3.5–5.1)
SODIUM: 132 mmol/L — AB (ref 135–145)

## 2018-03-12 LAB — AEROBIC/ANAEROBIC CULTURE W GRAM STAIN (SURGICAL/DEEP WOUND)

## 2018-03-12 LAB — AEROBIC/ANAEROBIC CULTURE (SURGICAL/DEEP WOUND)

## 2018-03-12 LAB — TYPE AND SCREEN
ABO/RH(D): A POS
Antibody Screen: NEGATIVE
UNIT DIVISION: 0

## 2018-03-12 LAB — DIFFERENTIAL
ABS IMMATURE GRANULOCYTES: 1.8 10*3/uL — AB (ref 0.00–0.07)
BASOS ABS: 0.1 10*3/uL (ref 0.0–0.1)
BASOS PCT: 1 %
EOS ABS: 0.2 10*3/uL (ref 0.0–0.5)
EOS PCT: 1 %
Immature Granulocytes: 9 %
LYMPHS ABS: 2.2 10*3/uL (ref 0.7–4.0)
Lymphocytes Relative: 12 %
MONOS PCT: 13 %
Monocytes Absolute: 2.5 10*3/uL — ABNORMAL HIGH (ref 0.1–1.0)
Neutro Abs: 12.4 10*3/uL — ABNORMAL HIGH (ref 1.7–7.7)
Neutrophils Relative %: 64 %

## 2018-03-12 LAB — PREALBUMIN: Prealbumin: 11.9 mg/dL — ABNORMAL LOW (ref 18–38)

## 2018-03-12 LAB — GLUCOSE, CAPILLARY
GLUCOSE-CAPILLARY: 129 mg/dL — AB (ref 70–99)
Glucose-Capillary: 124 mg/dL — ABNORMAL HIGH (ref 70–99)

## 2018-03-12 LAB — PHOSPHORUS: PHOSPHORUS: 4.1 mg/dL (ref 2.5–4.6)

## 2018-03-12 LAB — MAGNESIUM: MAGNESIUM: 2 mg/dL (ref 1.7–2.4)

## 2018-03-12 LAB — TRIGLYCERIDES: TRIGLYCERIDES: 94 mg/dL (ref <150)

## 2018-03-12 SURGERY — LAPAROTOMY, EXPLORATORY
Anesthesia: General | Site: Abdomen

## 2018-03-12 MED ORDER — PHENYLEPHRINE HCL 10 MG/ML IJ SOLN
INTRAMUSCULAR | Status: DC | PRN
  Administered 2018-03-12: 40 ug via INTRAVENOUS

## 2018-03-12 MED ORDER — FENTANYL CITRATE (PF) 250 MCG/5ML IJ SOLN
INTRAMUSCULAR | Status: AC
  Filled 2018-03-12: qty 5

## 2018-03-12 MED ORDER — IOHEXOL 300 MG/ML  SOLN
100.0000 mL | Freq: Once | INTRAMUSCULAR | Status: AC | PRN
  Administered 2018-03-12: 100 mL via INTRAVENOUS

## 2018-03-12 MED ORDER — DIPHENHYDRAMINE HCL 12.5 MG/5ML PO ELIX: 12.5000 mg | ORAL_SOLUTION | Freq: Four times a day (QID) | ORAL | Status: DC | PRN

## 2018-03-12 MED ORDER — HYDROMORPHONE HCL 1 MG/ML IJ SOLN
INTRAMUSCULAR | Status: AC
  Filled 2018-03-12: qty 2

## 2018-03-12 MED ORDER — ONDANSETRON HCL 4 MG/2ML IJ SOLN: 4.0000 mg | Freq: Four times a day (QID) | INTRAMUSCULAR | Status: DC | PRN

## 2018-03-12 MED ORDER — SUGAMMADEX SODIUM 200 MG/2ML IV SOLN
INTRAVENOUS | Status: AC
  Filled 2018-03-12: qty 2

## 2018-03-12 MED ORDER — HYDROMORPHONE 1 MG/ML IV SOLN
INTRAVENOUS | Status: DC
  Administered 2018-03-12: 25 mg via INTRAVENOUS
  Administered 2018-03-13: 3 mg via INTRAVENOUS
  Administered 2018-03-13: 2.7 mg via INTRAVENOUS
  Administered 2018-03-13: 3 mg via INTRAVENOUS
  Administered 2018-03-13: 1.8 mg via INTRAVENOUS
  Administered 2018-03-13: 0.9 mg via INTRAVENOUS
  Administered 2018-03-13: 3 mg via INTRAVENOUS
  Administered 2018-03-14: 1.8 mg via INTRAVENOUS
  Administered 2018-03-14: 25 mg via INTRAVENOUS
  Administered 2018-03-14: 1.2 mg via INTRAVENOUS
  Administered 2018-03-14 (×2): 1.5 mg via INTRAVENOUS
  Administered 2018-03-14: 1.8 mg via INTRAVENOUS
  Administered 2018-03-14: 1.2 mg via INTRAVENOUS
  Administered 2018-03-15: 2.7 mg via INTRAVENOUS
  Administered 2018-03-15 (×2): 2.4 mg via INTRAVENOUS
  Filled 2018-03-12 (×2): qty 25

## 2018-03-12 MED ORDER — SUGAMMADEX SODIUM 200 MG/2ML IV SOLN
INTRAVENOUS | Status: DC | PRN
  Administered 2018-03-12: 200 mg via INTRAVENOUS

## 2018-03-12 MED ORDER — FENTANYL CITRATE (PF) 100 MCG/2ML IJ SOLN
INTRAMUSCULAR | Status: DC | PRN
  Administered 2018-03-12 (×5): 50 ug via INTRAVENOUS

## 2018-03-12 MED ORDER — ROCURONIUM BROMIDE 50 MG/5ML IV SOSY
PREFILLED_SYRINGE | INTRAVENOUS | Status: AC
  Filled 2018-03-12: qty 5

## 2018-03-12 MED ORDER — ROCURONIUM BROMIDE 50 MG/5ML IV SOSY
PREFILLED_SYRINGE | INTRAVENOUS | Status: DC | PRN
  Administered 2018-03-12: 30 mg via INTRAVENOUS
  Administered 2018-03-12: 50 mg via INTRAVENOUS
  Administered 2018-03-12 (×2): 20 mg via INTRAVENOUS
  Administered 2018-03-12: 10 mg via INTRAVENOUS

## 2018-03-12 MED ORDER — HYDROMORPHONE HCL 1 MG/ML IJ SOLN
0.2500 mg | INTRAMUSCULAR | Status: DC | PRN
  Administered 2018-03-12 (×4): 0.5 mg via INTRAVENOUS

## 2018-03-12 MED ORDER — ENOXAPARIN SODIUM 40 MG/0.4ML ~~LOC~~ SOLN
40.0000 mg | SUBCUTANEOUS | Status: DC
  Administered 2018-03-12 – 2018-03-20 (×9): 40 mg via SUBCUTANEOUS
  Filled 2018-03-12 (×10): qty 0.4

## 2018-03-12 MED ORDER — INDOCYANINE GREEN 25 MG IV SOLR
INTRAVENOUS | Status: AC
  Filled 2018-03-12: qty 25

## 2018-03-12 MED ORDER — LIDOCAINE 2% (20 MG/ML) 5 ML SYRINGE
INTRAMUSCULAR | Status: AC
  Filled 2018-03-12: qty 5

## 2018-03-12 MED ORDER — INDOCYANINE GREEN 25 MG IV SOLR
INTRAVENOUS | Status: DC | PRN
  Administered 2018-03-12: 25 mg via INTRAVENOUS

## 2018-03-12 MED ORDER — MIDAZOLAM HCL 2 MG/2ML IJ SOLN
INTRAMUSCULAR | Status: AC
  Filled 2018-03-12: qty 2

## 2018-03-12 MED ORDER — DEXAMETHASONE SODIUM PHOSPHATE 10 MG/ML IJ SOLN
INTRAMUSCULAR | Status: AC
  Filled 2018-03-12: qty 1

## 2018-03-12 MED ORDER — LACTATED RINGERS IV SOLN
INTRAVENOUS | Status: DC | PRN
  Administered 2018-03-12 (×3): via INTRAVENOUS

## 2018-03-12 MED ORDER — ACETAMINOPHEN 10 MG/ML IV SOLN
INTRAVENOUS | Status: DC | PRN
  Administered 2018-03-12: 1000 mg via INTRAVENOUS

## 2018-03-12 MED ORDER — 0.9 % SODIUM CHLORIDE (POUR BTL) OPTIME
TOPICAL | Status: DC | PRN
  Administered 2018-03-12 (×2): 1000 mL

## 2018-03-12 MED ORDER — PROMETHAZINE HCL 25 MG/ML IJ SOLN: 6.2500 mg | INTRAMUSCULAR | Status: DC | PRN

## 2018-03-12 MED ORDER — ONDANSETRON HCL 4 MG/2ML IJ SOLN
INTRAMUSCULAR | Status: AC
  Filled 2018-03-12: qty 2

## 2018-03-12 MED ORDER — PROPOFOL 10 MG/ML IV BOLUS
INTRAVENOUS | Status: DC | PRN
  Administered 2018-03-12: 150 mg via INTRAVENOUS

## 2018-03-12 MED ORDER — LIDOCAINE 2% (20 MG/ML) 5 ML SYRINGE
INTRAMUSCULAR | Status: DC | PRN
  Administered 2018-03-12: 100 mg via INTRAVENOUS

## 2018-03-12 MED ORDER — ACETAMINOPHEN 10 MG/ML IV SOLN
INTRAVENOUS | Status: AC
  Filled 2018-03-12: qty 100

## 2018-03-12 MED ORDER — LACTATED RINGERS IV SOLN
INTRAVENOUS | Status: DC
  Administered 2018-03-12: 15:00:00 via INTRAVENOUS

## 2018-03-12 MED ORDER — DEXAMETHASONE SODIUM PHOSPHATE 10 MG/ML IJ SOLN
INTRAMUSCULAR | Status: DC | PRN
  Administered 2018-03-12: 10 mg via INTRAVENOUS

## 2018-03-12 MED ORDER — KCL IN DEXTROSE-NACL 20-5-0.45 MEQ/L-%-% IV SOLN
INTRAVENOUS | Status: DC
  Administered 2018-03-12: via INTRAVENOUS
  Filled 2018-03-12: qty 1000

## 2018-03-12 MED ORDER — MEPERIDINE HCL 50 MG/ML IJ SOLN: 6.2500 mg | INTRAMUSCULAR | Status: DC | PRN

## 2018-03-12 MED ORDER — SODIUM CHLORIDE 0.9% FLUSH
9.0000 mL | INTRAVENOUS | Status: DC | PRN
  Administered 2018-03-12: 9 mL via INTRAVENOUS
  Filled 2018-03-12: qty 9

## 2018-03-12 MED ORDER — MIDAZOLAM HCL 5 MG/5ML IJ SOLN
INTRAMUSCULAR | Status: DC | PRN
  Administered 2018-03-12 (×2): 2 mg via INTRAVENOUS

## 2018-03-12 MED ORDER — METHYLENE BLUE 0.5 % INJ SOLN
INTRAVENOUS | Status: DC | PRN
  Administered 2018-03-12: 50 mg via INTRAVENOUS

## 2018-03-12 MED ORDER — DIPHENHYDRAMINE HCL 50 MG/ML IJ SOLN: 12.5000 mg | Freq: Four times a day (QID) | INTRAMUSCULAR | Status: DC | PRN

## 2018-03-12 MED ORDER — METHYLENE BLUE 0.5 % INJ SOLN
INTRAVENOUS | Status: AC
  Filled 2018-03-12: qty 10

## 2018-03-12 MED ORDER — ONDANSETRON HCL 4 MG/2ML IJ SOLN
INTRAMUSCULAR | Status: DC | PRN
  Administered 2018-03-12: 4 mg via INTRAVENOUS

## 2018-03-12 MED ORDER — NALOXONE HCL 0.4 MG/ML IJ SOLN: 0.4000 mg | INTRAMUSCULAR | Status: DC | PRN

## 2018-03-12 MED ORDER — TRAVASOL 10 % IV SOLN
INTRAVENOUS | Status: AC
  Administered 2018-03-12: 23:00:00 via INTRAVENOUS
  Filled 2018-03-12: qty 1345.2

## 2018-03-12 SURGICAL SUPPLY — 68 items
BLADE CLIPPER SURG (BLADE) IMPLANT
BNDG GAUZE ELAST 4 BULKY (GAUZE/BANDAGES/DRESSINGS) ×2 IMPLANT
CANISTER SUCT 3000ML PPV (MISCELLANEOUS) ×2 IMPLANT
CHLORAPREP W/TINT 26ML (MISCELLANEOUS) IMPLANT
COUNTER NEEDLE 20 DBL MAG RED (NEEDLE) ×2 IMPLANT
COVER SURGICAL LIGHT HANDLE (MISCELLANEOUS) ×2 IMPLANT
COVER WAND RF STERILE (DRAPES) IMPLANT
DRAIN CHANNEL 19F RND (DRAIN) ×2 IMPLANT
DRAPE LAPAROSCOPIC ABDOMINAL (DRAPES) ×2 IMPLANT
DRAPE WARM FLUID 44X44 (DRAPE) ×2 IMPLANT
DRSG OPSITE POSTOP 4X10 (GAUZE/BANDAGES/DRESSINGS) IMPLANT
DRSG OPSITE POSTOP 4X8 (GAUZE/BANDAGES/DRESSINGS) IMPLANT
DRSG TEGADERM 2-3/8X2-3/4 SM (GAUZE/BANDAGES/DRESSINGS) ×2 IMPLANT
ELECT BLADE 6.5 EXT (BLADE) ×2 IMPLANT
ELECT CAUTERY BLADE 6.4 (BLADE) ×4 IMPLANT
ELECT REM PT RETURN 9FT ADLT (ELECTROSURGICAL) ×2
ELECTRODE REM PT RTRN 9FT ADLT (ELECTROSURGICAL) ×1 IMPLANT
EVACUATOR SILICONE 100CC (DRAIN) ×2 IMPLANT
GAUZE SPONGE 4X4 12PLY STRL LF (GAUZE/BANDAGES/DRESSINGS) ×2 IMPLANT
GLOVE BIO SURGEON STRL SZ 6.5 (GLOVE) ×2 IMPLANT
GLOVE BIOGEL PI IND STRL 8 (GLOVE) ×2 IMPLANT
GLOVE BIOGEL PI INDICATOR 8 (GLOVE) ×2
GLOVE ECLIPSE 7.5 STRL STRAW (GLOVE) ×4 IMPLANT
GLOVE SURG SS PI 8.0 STRL IVOR (GLOVE) ×2 IMPLANT
GOWN STRL REUS W/ TWL LRG LVL3 (GOWN DISPOSABLE) ×2 IMPLANT
GOWN STRL REUS W/TWL LRG LVL3 (GOWN DISPOSABLE) ×2
GUIDEWIRE STR DUAL SENSOR (WIRE) ×2 IMPLANT
KIT BASIN OR (CUSTOM PROCEDURE TRAY) ×2 IMPLANT
KIT OSTOMY DRAINABLE 2.75 STR (WOUND CARE) ×2 IMPLANT
KIT TURNOVER KIT B (KITS) ×2 IMPLANT
LIGASURE IMPACT 36 18CM CVD LR (INSTRUMENTS) ×4 IMPLANT
NS IRRIG 1000ML POUR BTL (IV SOLUTION) ×4 IMPLANT
PACK GENERAL/GYN (CUSTOM PROCEDURE TRAY) ×2 IMPLANT
PAD ABD 8X10 STRL (GAUZE/BANDAGES/DRESSINGS) ×2 IMPLANT
PAD ARMBOARD 7.5X6 YLW CONV (MISCELLANEOUS) ×2 IMPLANT
RELOAD PROXIMATE 75MM BLUE (ENDOMECHANICALS) ×6 IMPLANT
RELOAD PROXIMATE TA60MM BLUE (ENDOMECHANICALS) ×2 IMPLANT
SEPRAFILM PROCEDURAL PACK 3X5 (MISCELLANEOUS) IMPLANT
SPECIMEN JAR LARGE (MISCELLANEOUS) IMPLANT
SPONGE LAP 18X18 X RAY DECT (DISPOSABLE) ×12 IMPLANT
STAPLER GUN LINEAR PROX 60 (STAPLE) ×2 IMPLANT
STAPLER PROXIMATE 75MM BLUE (STAPLE) ×2 IMPLANT
STAPLER VISISTAT 35W (STAPLE) ×2 IMPLANT
STENT URET 6FRX24 CONTOUR (STENTS) ×2 IMPLANT
SUCTION POOLE TIP (SUCTIONS) ×2 IMPLANT
SUT ETHILON 2 0 FS 18 (SUTURE) ×2 IMPLANT
SUT NOVA 1 T20/GS 25DT (SUTURE) ×10 IMPLANT
SUT PDS AB 1 TP1 96 (SUTURE) ×4 IMPLANT
SUT SILK 2 0 SH CR/8 (SUTURE) ×2 IMPLANT
SUT SILK 2 0 TIES 10X30 (SUTURE) ×2 IMPLANT
SUT SILK 3 0 SH CR/8 (SUTURE) ×4 IMPLANT
SUT SILK 3 0 TIES 10X30 (SUTURE) ×2 IMPLANT
SUT VIC AB 2-0 SH 27 (SUTURE) ×4
SUT VIC AB 2-0 SH 27X BRD (SUTURE) ×4 IMPLANT
SUT VIC AB 2-0 UR5 27 (SUTURE) ×2 IMPLANT
SUT VIC AB 3-0 SH 18 (SUTURE) ×2 IMPLANT
SUT VIC AB 3-0 SH 27 (SUTURE) ×1
SUT VIC AB 3-0 SH 27XBRD (SUTURE) ×1 IMPLANT
SUT VIC AB 4-0 RB1 27 (SUTURE) ×3
SUT VIC AB 4-0 RB1 27XBRD (SUTURE) ×3 IMPLANT
SUT VIC AB 4-0 SH 27 (SUTURE) ×1
SUT VIC AB 4-0 SH 27XBRD (SUTURE) ×1 IMPLANT
SUT VICRYL 2-0 COATED 36IN (SUTURE) ×6 IMPLANT
SUT VICRYL 3-0 RB1 18 ABS (SUTURE) ×12 IMPLANT
TOWEL OR 17X26 10 PK STRL BLUE (TOWEL DISPOSABLE) ×2 IMPLANT
TRAY FOLEY MTR SLVR 16FR STAT (SET/KITS/TRAYS/PACK) IMPLANT
TUBE FEEDING ENTERAL 5FR 16IN (TUBING) ×2 IMPLANT
YANKAUER SUCT BULB TIP NO VENT (SUCTIONS) ×2 IMPLANT
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
## 2018-03-12 NOTE — Progress Notes (Signed)
Changed pt's midline dressing.Seems to be 4 tunnels now. Minimal drainage on dressing. Overnight output: NG tube- 100 mL RT drain- 5 mL LT drain- 10 mL Ostomy- 350 mL  Pt calling for pain meds almost every 2 hrs.
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
## 2018-03-12 NOTE — Progress Notes (Signed)
PT Cancellation Note  Patient Details Name: Richard E Wacha Jr. MRN: [id number] DOB: 09-03-1995   Cancelled Treatment:    Reason Eval/Treat Not Completed: Patient at procedure or test/unavailable.  Pt gone to OR.  Will see 11/12 as able. 03/12/2018  [place] Bing, PT [facility] Services [phone]  (pager) [phone]  (office)   Richard [name] 03/12/2018, 5:53 PM
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
## 2018-03-12 NOTE — Op Note (Signed)
OPERATIVE REPORT  DATE OF OPERATION: 03/12/2018  PATIENT:  Richard E Argabright Jr.  22 y.o. male  PRE-OPERATIVE DIAGNOSIS:  SMALL BOWEL OBSTRUCTION  POST-OPERATIVE DIAGNOSIS:  SMALL BOWEL OBSTRUCTION  INDICATION(S) FOR OPERATION:  The patient is status post exploratory laparotomy with small bowel resection and ileostomy along with a cecectomy.  Over the 12 days of his postoperative.  He developed an intra-abdominal fluid collection thought to be an abscess.  Percutaneous drainage yielded enteric contents.  Repeat CT scan today demonstrated a large amorphous air-filled and fluid-filled collection in his right lower quadrant and also his mid abdomen.  He was taken to the operating room for likely ischemic and or infarcted small bowel with enteric filled collection in the abdominal cavity.  FINDINGS: Large right lower quadrant and pelvic enteric the old fluid collection from necrotic distal small bowel leak.  Significant inflammation tethering is inflammatory process to the retroperitoneum in the right ureter.  PROCEDURE:  Procedure(s): EXPLORATORY LAPAROTOMY WITH SMALL BOWEL RESECTION AND ILEOSTOMY, PSAOAS WITH BOARIFLAP AND URETERAL REIMPLANTATION RIGHT.  SURGEON:  Surgeon(s): Jimmye Norman, MD Berna Bue, MD Bjorn Pippin, MD  ASSISTANT: Fredricka Bonine, MD  ANESTHESIA:   general  COMPLICATIONS:  Injury and transection of the right ureter  EBL: 150 ml  BLOOD ADMINISTERED: 300 CC PRBC  DRAINS: Nasogastric Tube, Urinary Catheter (Foley) and (19Fr.) Blake drain(s) in the RLQ and right paracolic area   SPECIMEN:  Source of Specimen:  Small bowel and ureter  COUNTS CORRECT:  YES  PROCEDURE DETAILS: The patient was taken to the operating room and placed on the table in supine position.  After an adequate general endotracheal anesthetic was administered, his abdomen was prepped and draped in usual sterile manner removing all of his prior dressings including the ileostomy bag where the stone  was found to be markedly necrotic and oozing stool around the margins of the ostomy.  A proper timeout was performed identifying the patient and procedure to be performed.  We opened the midline incision through the subcutaneous tissue which was already opened and cut out the previous closure sutures.  Immediately upon entering the peritoneal cavity a large amount of enteric contents were noted coming mostly from the lower abdomen.  We aspirated most of this using suction and irrigation removed a large amount of enteric contents.  We cleaned out most of these contents until we can get adequate visualization of both lying beneath.  There was significant adhesions between the viable small bowel which is possible and up to the left side of the abdomen and the very necrotic distal small bowel which is necrotic all the way up to the ileostomy.  We pulled the necrotic ileostomy out of the stoma site without having to cut it because he was markedly necrotic and floppy.  The distal 2 feet of small bowel was necrotic and we resected the necrotic area from the viable area using a GIA 75 stapler.  We continue to mobilize this area open up very carefully in order to get adequate viable proximal margins.  We came across the base of the mesentery in order to get adequate mobilization and subsequently remove all the devascularized and necrotic small bowel.  The remaining transverse colon and right colon which should be left in place as a long Hartman's pouch was still in place and appear to be viable.  We inspected the right lower quadrant area.  There was significant pockets of necrotic debris which we continue to remove using the LigaSure device and  also Kelly clamps and 2-0 Vicryl 2-0 silk ties.  Once we cleaned up this area was irrigated with large amount of saline we inspected the area and in the area where you normally would see the ureter the peritoneum had been clean and the proximal distal end of the right ureter  could be visualized.  There is approximately a 2 to 3 cm segment of ureter that was missing that upon inspecting the specimen can be found in the mesentery of the resected small bowel.  Immediately a urologist was called for consultation and he came in again repaired using a bladder flap that will be described in the subsequent and separate operative report.  Once the bladder was repaired and the ureter was repaired using a flap we went ahead and irrigated and closed.  The ileostomy was brought out the right side through the same ileostomy site and matured after the abdomen was closed.  A 19 [place] Blake drain was placed in the right paracolic area and the right lower quadrant and secured in place with a 2-0 nylon.  A Foley catheter was to remain in place and should not be removed for several weeks.  We closed the abdomen using interrupted simple stitches of #1 Novafil.  We left the subcutaneous tissue open for wet-to-dry dressings.  All needle counts, sponge counts, and instrument counts were correct.  The NG tube was in place and was palpated to be in adequate position.  PATIENT DISPOSITION:  PACU - guarded condition.   Jimmye Norman 11/11/20198:04 PM

## 2018-03-12 NOTE — Progress Notes (Signed)
I have reviewed the patient's CT scan from today and it shows a large intra-abdominal process which is not likely to get better with antibiotics and percutaneous drain.  Will re-explore and probably resect ischemic or infarcted small bowel and redo his ileostomy.  I have explained this to the patient and he understands the need for further surgery.  Marta Lamas. Gae Bon, MD, FACS [phone] Trauma Surgeon

## 2018-03-12 NOTE — Anesthesia Procedure Notes (Addendum)
Procedure Name: Intubation Date/Time: 03/12/2018 3:57 PM Performed by: Trinna Post., CRNA Pre-anesthesia Checklist: Patient identified, Emergency Drugs available, Suction available, Patient being monitored and Timeout performed Patient Re-evaluated:Patient Re-evaluated prior to induction Oxygen Delivery Method: Circle system utilized Preoxygenation: Pre-oxygenation with 100% oxygen Induction Type: IV induction Ventilation: Mask ventilation without difficulty Laryngoscope Size: Mac and 4 Grade View: Grade I Tube type: Oral Tube size: 7.5 mm Number of attempts: 1 Airway Equipment and Method: Stylet Placement Confirmation: ETT inserted through vocal cords under direct vision,  positive ETCO2 and breath sounds checked- equal and bilateral Secured at: 22 cm Tube secured with: Tape Dental Injury: Teeth and Oropharynx as per pre-operative assessment

## 2018-03-12 NOTE — Anesthesia Preprocedure Evaluation (Signed)
Anesthesia Evaluation  Patient identified by MRN, date of birth, ID band Patient awake    Reviewed: Allergy & Precautions, NPO status , Patient's Chart, lab work & pertinent test results  Airway Mallampati: II  TM Distance: >3 FB Neck ROM: Full    Dental  (+) Teeth Intact   Pulmonary  Tachypnea, left sided rib fx   breath sounds clear to auscultation       Cardiovascular negative cardio ROS   Rhythm:Regular     Neuro/Psych negative neurological ROS  negative psych ROS   GI/Hepatic negative GI ROS, Neg liver ROS,   Endo/Other  negative endocrine ROS  Renal/GU R Renal contusion      Musculoskeletal S/P SBR, ileocecectomy, ileostomy    Abdominal   Peds  Hematology negative hematology ROS (+)   Anesthesia Other Findings   Reproductive/Obstetrics negative OB ROS                             Anesthesia Physical  Anesthesia Plan  ASA: I  Anesthesia Plan: General   Post-op Pain Management:    Induction: Intravenous  PONV Risk Score and Plan: 4 or greater and Ondansetron, Dexamethasone and Midazolam  Airway Management Planned: Oral ETT  Additional Equipment: None  Intra-op Plan:   Post-operative Plan: Extubation in OR  Informed Consent: I have reviewed the patients History and Physical, chart, labs and discussed the procedure including the risks, benefits and alternatives for the proposed anesthesia with the patient or authorized representative who has indicated his/her understanding and acceptance.   Dental advisory given  Plan Discussed with: CRNA and Surgeon  Anesthesia Plan Comments:         Anesthesia Quick Evaluation

## 2018-03-12 NOTE — Consult Note (Signed)
   WOC Nurse ostomy follow up Surgical team following for assessment and plan of care to abd wound. Stoma type/location: RLQ, ileostomy Stomal assessment/size: 1 3/4" previously noted necrotic outer layer of stoma is beginning to loosen and fall off; surgical team at bedside to assess appearance during pouch change.  Shape is now 1 3/4 and oval. Peristomal assessment: intact skin surrounding, 50% yellow and 50% red stoma visible in patchy areas where necrotic layer has loosened. Treatment options for stomal/peristomal skin: applied barrier ring to attempt to maintain a seal. Output: large amt  flatus, mod dark brown liquid stool Ostomy pouching: 2pc  Education provided: Demonstrated pouch change and barrier ring usage today, fiance at bedside for process but pt is feeling poorly and did not participate. Extra supplies and educational materials at the bedside and  Enrolled patient in [employer] Discharge program: Yes, previously WOC nurse will follow along with you for continued ostomy and wound care support.  Cammie Mcgee MSN, RN, CWOCN, [place], CNS [phone]

## 2018-03-12 NOTE — Transfer of Care (Signed)
Immediate Anesthesia Transfer of Care Note  Patient: Richard E Sieg Jr.  Procedure(s) Performed: EXPLORATORY LAPAROTOMY WITH SMALL BOWEL RESECTION AND ILEOSTOMY, PSAOAS WITH BOARIFLAP AND URETERAL REIMPLANTATION RIGHT. (N/A Abdomen)  Patient Location: PACU  Anesthesia Type:General  Level of Consciousness: awake, sedated, patient cooperative and responds to stimulation  Airway & Oxygen Therapy: Patient Spontanous Breathing and Patient connected to face mask oxygen  Post-op Assessment: Report given to RN and Patient moving all extremities X 4  Post vital signs: Reviewed and stable  Last Vitals:  Vitals Value Taken Time  BP 115/71 03/12/2018  8:13 PM  Temp    Pulse 132 03/12/2018  8:17 PM  Resp 29 03/12/2018  8:17 PM  SpO2 100 % 03/12/2018  8:17 PM  Vitals shown include unvalidated device data.  Last Pain:  Vitals:   03/12/18 1400  TempSrc:   PainSc: 9       Patients Stated Pain Goal: 4 (03/11/18 1536)  Complications: No apparent anesthesia complications

## 2018-03-12 NOTE — Anesthesia Postprocedure Evaluation (Signed)
Anesthesia Post Note  Patient: Richard [name].  Procedure(s) Performed: EXPLORATORY LAPAROTOMY WITH SMALL BOWEL RESECTION AND ILEOSTOMY, PSAOAS WITH BOARIFLAP AND URETERAL REIMPLANTATION RIGHT. (N/A Abdomen)     Patient location during evaluation: PACU Anesthesia Type: General Level of consciousness: awake and alert Pain management: pain level controlled Vital Signs Assessment: post-procedure vital signs reviewed and stable Respiratory status: spontaneous breathing, nonlabored ventilation, respiratory function stable and patient connected to nasal cannula oxygen Cardiovascular status: blood pressure returned to baseline and stable Postop Assessment: no apparent nausea or vomiting Anesthetic complications: no    Last Vitals:  Vitals:   03/12/18 2029 03/12/18 2043  BP:  130/85  Pulse: (!) 127 (!) 121  Resp: (!) 28 (!) 24  Temp:    SpO2: 100% 98%    Last Pain:  Vitals:   03/12/18 2013  TempSrc:   PainSc: (P) 10-Worst pain ever                 Richard [name] DAVID

## 2018-03-13 ENCOUNTER — Encounter (HOSPITAL_COMMUNITY): Payer: Self-pay | Admitting: General Surgery

## 2018-03-13 LAB — CBC
HEMATOCRIT: 32.8 % — AB (ref 39.0–52.0)
HEMOGLOBIN: 10.4 g/dL — AB (ref 13.0–17.0)
MCH: 27 pg (ref 26.0–34.0)
MCHC: 31.7 g/dL (ref 30.0–36.0)
MCV: 85.2 fL (ref 80.0–100.0)
Platelets: 860 10*3/uL — ABNORMAL HIGH (ref 150–400)
RBC: 3.85 MIL/uL — AB (ref 4.22–5.81)
RDW: 13.4 % (ref 11.5–15.5)
WBC: 31.2 10*3/uL — ABNORMAL HIGH (ref 4.0–10.5)
nRBC: 0.1 % (ref 0.0–0.2)

## 2018-03-13 LAB — BASIC METABOLIC PANEL
ANION GAP: 8 (ref 5–15)
BUN: 20 mg/dL (ref 6–20)
CHLORIDE: 102 mmol/L (ref 98–111)
CO2: 21 mmol/L — AB (ref 22–32)
Calcium: 8.3 mg/dL — ABNORMAL LOW (ref 8.9–10.3)
Creatinine, Ser: 0.87 mg/dL (ref 0.61–1.24)
Glucose, Bld: 315 mg/dL — ABNORMAL HIGH (ref 70–99)
POTASSIUM: 5.5 mmol/L — AB (ref 3.5–5.1)
Sodium: 131 mmol/L — ABNORMAL LOW (ref 135–145)

## 2018-03-13 LAB — GLUCOSE, CAPILLARY
GLUCOSE-CAPILLARY: 218 mg/dL — AB (ref 70–99)
GLUCOSE-CAPILLARY: 317 mg/dL — AB (ref 70–99)
Glucose-Capillary: 142 mg/dL — ABNORMAL HIGH (ref 70–99)
Glucose-Capillary: 162 mg/dL — ABNORMAL HIGH (ref 70–99)
Glucose-Capillary: 249 mg/dL — ABNORMAL HIGH (ref 70–99)
Glucose-Capillary: 311 mg/dL — ABNORMAL HIGH (ref 70–99)

## 2018-03-13 MED ORDER — TRAVASOL 10 % IV SOLN
INTRAVENOUS | Status: AC
  Administered 2018-03-13: 18:00:00 via INTRAVENOUS
  Filled 2018-03-13: qty 1345.2

## 2018-03-13 MED ORDER — METOPROLOL TARTRATE 5 MG/5ML IV SOLN
5.0000 mg | INTRAVENOUS | Status: DC | PRN
  Administered 2018-03-14 – 2018-03-19 (×4): 5 mg via INTRAVENOUS
  Filled 2018-03-13 (×5): qty 5

## 2018-03-13 MED ORDER — ALBUMIN HUMAN 5 % IV SOLN
25.0000 g | Freq: Once | INTRAVENOUS | Status: AC
  Administered 2018-03-13: 25 g via INTRAVENOUS
  Filled 2018-03-13: qty 500

## 2018-03-13 MED ORDER — SODIUM POLYSTYRENE SULFONATE 15 GM/60ML PO SUSP
30.0000 g | Freq: Once | ORAL | Status: AC
  Administered 2018-03-13: 30 g
  Filled 2018-03-13: qty 120

## 2018-03-13 MED ORDER — ACETAMINOPHEN 650 MG RE SUPP
650.0000 mg | RECTAL | Status: DC | PRN
  Administered 2018-03-13 – 2018-03-14 (×2): 650 mg via RECTAL
  Filled 2018-03-13 (×2): qty 1

## 2018-03-13 MED ORDER — SODIUM CHLORIDE 0.9 % IV SOLN
3.0000 g | Freq: Four times a day (QID) | INTRAVENOUS | Status: DC
  Administered 2018-03-13 – 2018-03-31 (×71): 3 g via INTRAVENOUS
  Filled 2018-03-13 (×74): qty 3

## 2018-03-13 MED ORDER — METHOCARBAMOL 1000 MG/10ML IJ SOLN
1000.0000 mg | Freq: Three times a day (TID) | INTRAVENOUS | Status: DC | PRN
  Administered 2018-03-13: 1000 mg via INTRAVENOUS
  Filled 2018-03-13: qty 10

## 2018-03-13 MED ORDER — SODIUM CHLORIDE 0.9 % IV SOLN
INTRAVENOUS | Status: DC
  Administered 2018-03-13 – 2018-03-18 (×7): via INTRAVENOUS

## 2018-03-13 MED ORDER — DEXTROSE-NACL 5-0.45 % IV SOLN: INTRAVENOUS | Status: DC

## 2018-03-13 MED ORDER — INSULIN ASPART 100 UNIT/ML ~~LOC~~ SOLN
0.0000 [IU] | SUBCUTANEOUS | Status: DC
  Administered 2018-03-13: 3 [IU] via SUBCUTANEOUS
  Administered 2018-03-13: 11 [IU] via SUBCUTANEOUS
  Administered 2018-03-13 (×2): 5 [IU] via SUBCUTANEOUS
  Administered 2018-03-14 – 2018-03-15 (×8): 2 [IU] via SUBCUTANEOUS
  Administered 2018-03-15: 3 [IU] via SUBCUTANEOUS
  Administered 2018-03-15 (×2): 2 [IU] via SUBCUTANEOUS
  Administered 2018-03-16: 3 [IU] via SUBCUTANEOUS
  Administered 2018-03-16 – 2018-03-20 (×6): 2 [IU] via SUBCUTANEOUS

## 2018-03-13 NOTE — Consult Note (Addendum)
WOC Nurse ostomy follow up Surgical team following for assessment and plan of care to abd wound.  Necrotic outer section of stoma was debrided yesterday in surgery.  Current pouch is leaking behind the barrier. Stoma type/location:RLQ, ileostomy Stomal assessment/size:1 3/4" oval stoma, below skin level, unable to visualize os, 90% red and moist, 10% eschar to edges  Peristomal assessment:intact skin surrounding Treatment options for stomal/peristomal skin:applied barrier ring to attempt to maintain a seal. Output: large amt flatus, mod brown liquid stool Ostomy pouching: 1pc  Education provided:Performed pouch change and barrier ring, fiance at bedside for process but pt is feeling poorly and did not participate. Extra supplies and educational materials at the bedside.  Ordered flexible convex pouches for next change to attempt to avoid leakage from challenging pouching situation.  Enrolled patient in [employer] Discharge program: Yes, previously WOC nurse will follow along with you for continued ostomyand wound care support.  Cammie Mcgeeawn Richard Manninen MSN, RN, CWOCN, [place], CNS [phone]
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
## 2018-03-13 NOTE — Progress Notes (Addendum)
Pharmacy Antibiotic Note  Richard E Margreta JourneyStickler Jr. is a 22 y.o. male admitted on 02/28/2018 with intra-abdominal infection.  Pharmacy has been consulted for Unasyn dosing. S/p ex-lap with SBR, ileostomy, psoas with boariflap and ureteral reimplantation on 03/12/18  Scr 0.87, crcl ~14540mL/min, WBC 32.8 today. Patients cultures positive for pan-sensitive e.coli, e. Faecalis, and B. Vulgatus. Patient has been on Zosyn narrowing spectrum of antibiotic to Unasyn.    Plan: Unasyn 3g IV every 6 hours Follow-up / monitor clinical improvement, signs / symptoms of infection, drainage amount, LOT, and de-escalation to oral plans.  Height: 5' 10.98" (180.3 cm) Weight: 180 lb 8.9 oz (81.9 kg) IBW/kg (Calculated) : 75.26  Temp (24hrs), Avg:98.3 F (36.8 C), Min:97.5 F (36.4 C), Max:99.7 F (37.6 C)  Recent Labs  Lab 03/09/18 0344 03/10/18 0701 03/10/18 1500 03/12/18 0452 03/12/18 2253 03/13/18 0405  WBC 24.2* 20.2* 19.6* 19.2* 39.0* 31.2*  CREATININE 0.99 0.86  --  0.69 0.77 0.87    Estimated Creatinine Clearance: 143.1 mL/min (by C-G formula based on SCr of 0.87 mg/dL).    No Known Allergies  Antimicrobials this admission: Zosyn 11/6 >> 11/12 Cefotetan 10/30 >>11/2 Unasyn 11/12 >>  Dose adjustments this admission: n/a  Microbiology results: 10/30 MRSA PCR - negative 11/5 UCx - negative 11/5 BCx - negative 11/7 abscess cx - E.coli pan-sens + E.faecalis pan-sens; B. Vulgatus 11/7 pelvis cx - E.faecalis pan-sens  Thank you for allowing pharmacy to be a part of this patient's care.  Bradley FerrisJoshua Teiana Hajduk, PharmD 03/13/2018 3:36 PM PGY-1 Pharmacy Resident Direct Phone: [phone] Please check [email or web address] for unit-specific pharmacist phone numbers
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
## 2018-03-13 NOTE — Plan of Care (Signed)
Pt states pain level 5/10, resting comfortably in bed with visitor at bedside. Resp e/u, NAD.

## 2018-03-13 NOTE — Anesthesia Postprocedure Evaluation (Addendum)
Anesthesia Post Note  Patient: Richard E Keener Jr.  Procedure(s) Performed: INTRAMEDULLARY (IM) NAIL FEMORAL (Right Leg Upper) REMOVAL EXTERNAL FIXATION LEG (Right Leg Upper)     Patient location during evaluation: SICU Anesthesia Type: General Level of consciousness: sedated Pain management: pain level controlled Vital Signs Assessment: post-procedure vital signs reviewed and stable Respiratory status: patient remains intubated per anesthesia plan Cardiovascular status: stable Postop Assessment: no apparent nausea or vomiting Anesthetic complications: no    Last Vitals:  Vitals:   03/13/18 1700 03/13/18 1800  BP: 137/81 126/73  Pulse: (!) 134 (!) 140  Resp: (!) 22 14  Temp:    SpO2: 96% 94%    Last Pain:  Vitals:   03/13/18 1600  TempSrc: Oral  PainSc: 5                  Richard [name]
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
## 2018-03-14 LAB — CBC
HEMATOCRIT: 23.5 % — AB (ref 39.0–52.0)
HEMOGLOBIN: 7.5 g/dL — AB (ref 13.0–17.0)
MCH: 27.9 pg (ref 26.0–34.0)
MCHC: 31.9 g/dL (ref 30.0–36.0)
MCV: 87.4 fL (ref 80.0–100.0)
Platelets: 609 10*3/uL — ABNORMAL HIGH (ref 150–400)
RBC: 2.69 MIL/uL — AB (ref 4.22–5.81)
RDW: 13.7 % (ref 11.5–15.5)
WBC: 28.5 10*3/uL — AB (ref 4.0–10.5)
nRBC: 0.1 % (ref 0.0–0.2)

## 2018-03-14 LAB — GLUCOSE, CAPILLARY
GLUCOSE-CAPILLARY: 140 mg/dL — AB (ref 70–99)
GLUCOSE-CAPILLARY: 134 mg/dL — AB (ref 70–99)
Glucose-Capillary: 124 mg/dL — ABNORMAL HIGH (ref 70–99)
Glucose-Capillary: 125 mg/dL — ABNORMAL HIGH (ref 70–99)
Glucose-Capillary: 129 mg/dL — ABNORMAL HIGH (ref 70–99)
Glucose-Capillary: 130 mg/dL — ABNORMAL HIGH (ref 70–99)

## 2018-03-14 LAB — HEMOGLOBIN AND HEMATOCRIT, BLOOD
HCT: 26.6 % — ABNORMAL LOW (ref 39.0–52.0)
HEMOGLOBIN: 8.3 g/dL — AB (ref 13.0–17.0)

## 2018-03-14 LAB — BASIC METABOLIC PANEL
Anion gap: 5 (ref 5–15)
BUN: 16 mg/dL (ref 6–20)
CALCIUM: 7.6 mg/dL — AB (ref 8.9–10.3)
CHLORIDE: 105 mmol/L (ref 98–111)
CO2: 26 mmol/L (ref 22–32)
Creatinine, Ser: 0.72 mg/dL (ref 0.61–1.24)
GFR calc non Af Amer: >60 mL/min (ref >60)
Glucose, Bld: 149 mg/dL — ABNORMAL HIGH (ref 70–99)
Potassium: 3.7 mmol/L (ref 3.5–5.1)
Sodium: 136 mmol/L (ref 135–145)

## 2018-03-14 LAB — PREPARE RBC (CROSSMATCH)

## 2018-03-14 MED ORDER — TRAVASOL 10 % IV SOLN
INTRAVENOUS | Status: AC
  Administered 2018-03-14: 17:00:00 via INTRAVENOUS
  Filled 2018-03-14: qty 1345.2

## 2018-03-14 MED ORDER — ACETAMINOPHEN 160 MG/5ML PO SOLN
650.0000 mg | Freq: Four times a day (QID) | ORAL | Status: DC
  Administered 2018-03-14 – 2018-03-15 (×4): 650 mg
  Administered 2018-03-15: 02:00:00
  Filled 2018-03-14 (×5): qty 20.3
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
## 2018-03-14 NOTE — Progress Notes (Signed)
2 Days Post-Op  Subjective: Richard [name] is doing well post op.   He has no flank pain.  His urine is clear and the JP drainage is declining.  ROS:  Review of Systems  Constitutional: Negative for chills and fever.  Genitourinary: Negative for flank pain and hematuria.  He is thirsty  Anti-infectives: Anti-infectives (From admission, onward)   Start     Dose/Rate Route Frequency Ordered Stop   03/13/18 1600  Ampicillin-Sulbactam (UNASYN) 3 g in sodium chloride 0.9 % 100 mL IVPB     3 g 200 mL/hr over 30 Minutes Intravenous Every 6 hours 03/13/18 1538     03/07/18 1600  piperacillin-tazobactam (ZOSYN) IVPB 3.375 g  Status:  Discontinued     3.375 g 12.5 mL/hr over 240 Minutes Intravenous Every 8 hours 03/07/18 0910 03/13/18 1537   03/07/18 0930  piperacillin-tazobactam (ZOSYN) IVPB 3.375 g     3.375 g 100 mL/hr over 30 Minutes Intravenous  Once 03/07/18 0910 03/07/18 1104   03/01/18 1800  ceFAZolin (ANCEF) IVPB 2g/100 mL premix  Status:  Discontinued     2 g 200 mL/hr over 30 Minutes Intravenous Every 8 hours 03/01/18 1249 03/01/18 1405   03/01/18 1115  vancomycin (VANCOCIN) powder  Status:  Discontinued       As needed 03/01/18 1115 03/01/18 1238   03/01/18 0938  ceFAZolin (ANCEF) 2-4 GM/100ML-% IVPB    Note to Pharmacy:  Grace Blight   : cabinet override      03/01/18 0938 03/01/18 2144   03/01/18 0600  ceFAZolin (ANCEF) IVPB 2g/100 mL premix  Status:  Discontinued     2 g 200 mL/hr over 30 Minutes Intravenous On call to O.R. 02/28/18 1815 02/28/18 1820   02/28/18 2100  cefoTEtan (CEFOTAN) 2 g in sodium chloride 0.9 % 100 mL IVPB  Status:  Discontinued     2 g 200 mL/hr over 30 Minutes Intravenous Every 12 hours 02/28/18 1339 02/28/18 1921   02/28/18 2100  cefoTEtan (CEFOTAN) 2 g in sodium chloride 0.9 % 100 mL IVPB  Status:  Discontinued     2 g 200 mL/hr over 30 Minutes Intravenous Every 12 hours 02/28/18 1916 02/28/18 1916   02/28/18 2100  cefoTEtan in Dextrose 5% (CEFOTAN) IVPB 2  g     2 g 100 mL/hr over 30 Minutes Intravenous Every 12 hours 02/28/18 1921 03/03/18 2159   02/28/18 0915  cefoTEtan in Dextrose 5% (CEFOTAN) IVPB 2 g  Status:  Discontinued     2 g 100 mL/hr over 30 Minutes Intravenous To Surgery 02/28/18 0907 02/28/18 1339   02/28/18 0900  cefoTEtan (CEFOTAN) 2 g in sodium chloride 0.9 % 100 mL IVPB     2 g 200 mL/hr over 30 Minutes Intravenous  Once 02/28/18 0816 02/28/18 9509      Current Facility-Administered Medications  Medication Dose Route Frequency Provider Last Rate Last Dose  . 0.9 %  sodium chloride infusion   Intravenous PRN Judeth Horn, MD 10 mL/hr at 03/10/18 1628 500 mL at 03/10/18 1628  . 0.9 %  sodium chloride infusion   Intravenous Continuous Georganna Skeans, MD 75 mL/hr at 03/14/18 1500    . acetaminophen (TYLENOL) solution 650 mg  650 mg Per Tube Q6H Judeth Horn, MD   650 mg at 03/14/18 1549  . Ampicillin-Sulbactam (UNASYN) 3 g in sodium chloride 0.9 % 100 mL IVPB  3 g Intravenous Q6H Georganna Skeans, MD 200 mL/hr at 03/14/18 1551 3 g at 03/14/18 1551  .  bacitracin ointment   Topical BID Judeth Horn, MD   1 application at 16/10/96 0914  . chlorhexidine gluconate (MEDLINE KIT) (PERIDEX) 0.12 % solution 15 mL  15 mL Mouth Rinse BID Judeth Horn, MD   15 mL at 03/14/18 0800  . Chlorhexidine Gluconate Cloth 2 % PADS 6 each  6 each Topical Q0600 Judeth Horn, MD   6 each at 03/14/18 0914  . diphenhydrAMINE (BENADRYL) injection 12.5 mg  12.5 mg Intravenous Q6H PRN Judeth Horn, MD       Or  . diphenhydrAMINE (BENADRYL) 12.5 MG/5ML elixir 12.5 mg  12.5 mg Oral Q6H PRN Judeth Horn, MD      . enoxaparin (LOVENOX) injection 40 mg  40 mg Subcutaneous Q24H Judeth Horn, MD   40 mg at 03/14/18 0914  . HYDROmorphone (DILAUDID) 1 mg/mL PCA injection   Intravenous Q4H Judeth Horn, MD   25 mg at 03/14/18 1045  . Influenza vac split quadrivalent PF (FLUARIX) injection 0.5 mL  0.5 mL Intramuscular Tomorrow-1000 Judeth Horn, MD      . insulin  aspart (novoLOG) injection 0-15 Units  0-15 Units Subcutaneous Q4H Georganna Skeans, MD   2 Units at 03/14/18 1601  . lactated ringers infusion   Intravenous Continuous Judeth Horn, MD   Stopped at 03/12/18 1845  . MEDLINE mouth rinse  15 mL Mouth Rinse BID Judeth Horn, MD   15 mL at 03/14/18 0915  . menthol-cetylpyridinium (CEPACOL) lozenge 3 mg  1 lozenge Oral PRN Judeth Horn, MD      . methocarbamol (ROBAXIN) 1,000 mg in dextrose 5 % 50 mL IVPB  1,000 mg Intravenous Q8H Judeth Horn, MD   Stopped at 03/14/18 1337  . methocarbamol (ROBAXIN) 1,000 mg in dextrose 5 % 50 mL IVPB  1,000 mg Intravenous Q8H PRN Georganna Skeans, MD   Stopped at 03/13/18 1005  . metoprolol tartrate (LOPRESSOR) injection 10 mg  10 mg Intravenous BID Judeth Horn, MD   10 mg at 03/14/18 0914  . metoprolol tartrate (LOPRESSOR) injection 5 mg  5 mg Intravenous Q3H PRN Donnie Mesa, MD   5 mg at 03/14/18 0359  . naloxone [facility]) injection 0.4 mg  0.4 mg Intravenous PRN Judeth Horn, MD       And  . sodium chloride flush (NS) 0.9 % injection 9 mL  9 mL Intravenous PRN Judeth Horn, MD   9 mL at 03/12/18 2307  . ondansetron (ZOFRAN) injection 4 mg  4 mg Intravenous Q6H PRN Judeth Horn, MD      . promethazine (PHENERGAN) injection 25 mg  25 mg Intravenous Q4H PRN Judeth Horn, MD   25 mg at 03/08/18 0124  . TPN ADULT (ION)   Intravenous Continuous TPN Karren Cobble, RPH 95 mL/hr at 03/14/18 1500    . TPN ADULT (ION)   Intravenous Continuous TPN Karren Cobble, RPH 95 mL/hr at 03/14/18 1720       Objective: Vital signs in last 24 hours: Temp:  [98.4 F (36.9 C)-102.9 F (39.4 C)] 100 F (37.8 C) (11/13 1400) Pulse Rate:  [123-146] 127 (11/13 1400) Resp:  [13-29] 14 (11/13 1400) BP: (124-147)/(62-85) 138/81 (11/13 1400) SpO2:  [92 %-99 %] 97 % (11/13 1400)  Intake/Output from previous day: 11/12 0701 - 11/13 0700 In: 5120 [I.V.:3952.5; NG/GT:60; IV Piggyback:1107.5] Out: 3945 [Urine:2425; Emesis/NG  output:1050; Drains:115; Stool:355] Intake/Output this shift: Total I/O In: 1784.6 [I.V.:1318.6; Blood:316; IV Piggyback:150] Out: 0454 [Urine:665; Emesis/NG output:300; Drains:14; Stool:60]   Physical Exam  Constitutional: He  appears well-developed and well-nourished.  Abdominal:  No right flank tenderness.   Vitals reviewed.   Lab Results:  Recent Labs    03/13/18 0405 03/14/18 0457 03/14/18 1529  WBC 31.2* 28.5*  --   HGB 10.4* 7.5* 8.3*  HCT 32.8* 23.5* 26.6*  PLT 860* 609*  --    BMET Recent Labs    03/13/18 0405 03/14/18 0457  NA 131* 136  K 5.5* 3.7  CL 102 105  CO2 21* 26  GLUCOSE 315* 149*  BUN 20 16  CREATININE 0.87 0.72  CALCIUM 8.3* 7.6*   PT/INR No results for input(s): LABPROT, INR in the last 72 hours. ABG No results for input(s): PHART, HCO3 in the last 72 hours.  Invalid input(s): PCO2, PO2  Studies/Results: No results found.   Assessment and Plan: S/P right reimplant with psoas hitch and boari flap with clear urine and no flank pain.  Continue foley drainage.       LOS: 14 days    Irine Seal 03/14/2018 [phone] ID: Richard [name] E Courter Jr., male   DOB: 23-Jan-1996, 22 y.o.   MRN: [id number]

## 2018-03-14 NOTE — Progress Notes (Addendum)
Trauma Service Note  Subjective: Patient states that he is doing better.  Pain with dressing changes  Objective: Vital signs in last 24 hours: Temp:  [98 F (36.7 C)-102.9 F (39.4 C)] 98.4 F (36.9 C) (11/13 0800) Pulse Rate:  [128-146] 129 (11/13 0800) Resp:  [12-29] 21 (11/13 0800) BP: (124-147)/(62-85) 147/81 (11/13 0800) SpO2:  [87 %-99 %] 96 % (11/13 0800) Last BM Date: 03/11/18  Intake/Output from previous day: 11/12 0701 - 11/13 0700 In: 5120 [I.V.:3952.5; NG/GT:60; IV Piggyback:1107.5] Out: 3945 [Urine:2425; Emesis/NG output:1050; Drains:115; Stool:355] Intake/Output this shift: Total I/O In: 170.1 [I.V.:170.1] Out: 75 [Urine:75]  General: No acute distress.  Tachycardic  Lungs: Clear to auscultation.  Abd: Softer, still tender.  Ileostomy is functioning.  No protruding nipple.  Midline wound is a bit weepy, but starting to granulate well.  Patient does not want NPWD/VAC  Extremities: No changes  Neuro: Intact  Lab Results: CBC  Recent Labs    03/13/18 0405 03/14/18 0457  WBC 31.2* 28.5*  HGB 10.4* 7.5*  HCT 32.8* 23.5*  PLT 860* 609*   BMET Recent Labs    03/13/18 0405 03/14/18 0457  NA 131* 136  K 5.5* 3.7  CL 102 105  CO2 21* 26  GLUCOSE 315* 149*  BUN 20 16  CREATININE 0.87 0.72  CALCIUM 8.3* 7.6*   PT/INR No results for input(s): LABPROT, INR in the last 72 hours. ABG No results for input(s): PHART, HCO3 in the last 72 hours.  Invalid input(s): PCO2, PO2  Studies/Results: Ct Abdomen Pelvis W Contrast  Result Date: 03/12/2018 CLINICAL DATA:  Follow-up pelvic abscess drainage EXAM: CT ABDOMEN AND PELVIS WITH CONTRAST TECHNIQUE: Multidetector CT imaging of the abdomen and pelvis was performed using the standard protocol following bolus administration of intravenous contrast. CONTRAST:  OMNIPAQUE IOHEXOL 300 MG/ML  SOLN COMPARISON:  03/07/2018, 03/08/2018 FINDINGS: Lower chest: Lung bases demonstrate bibasilar atelectasis left  slightly greater than right but improved from previous exams. Hepatobiliary: No focal liver abnormality is seen. No gallstones, gallbladder wall thickening, or biliary dilatation. Pancreas: Unremarkable. No pancreatic ductal dilatation or surrounding inflammatory changes. Spleen: Normal in size without focal abnormality. Adrenals/Urinary Tract: Adrenal glands are within normal limits. 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 No obstructive changes are seen. The bladder is partially distended. Stomach/Bowel: Postsurgical changes are noted in the right lower quadrant in the area of previous surgery again suspicious for anastomotic leak. There remains a large air-fluid collection which now has a drainage catheter within. Peritoneal enhancement is noted surrounding this collection. It has decreased somewhat in the interval from the prior exam at which time it measured 13 cm in greatest AP dimension. It now measures 9.8 cm in greatest AP dimension. It extends over the right psoas muscle inferiorly similar to that seen on the prior exam. On the coronal imaging there is again noted to suggestion of communication of the collection with a small bowel loop prior to the right lower quadrant ostomy. Nasogastric catheter is noted within the stomach. No definitive obstructive changes are seen. Vascular/Lymphatic: No significant vascular findings are present. No enlarged abdominal or pelvic lymph nodes. Reproductive: Prostate is unremarkable. Other: There is a pelvic cul-de-sac fluid collection again identified but significantly decreased in size now measuring 4.8 cm in greatest transverse dimension. This is decreased from 8 cm in transverse dimension. A second drainage catheter is noted within. No new focal fluid collection is identified. Musculoskeletal: Surgical fixation of proximal right femur is noted. Chronic rib fractures  are seen on the left stable from previous exams. IMPRESSION: Fluid collections deep within  the pelvis as well as within the right lower quadrant as described. Both contain drainage catheters and have improved in the interval from the prior exam. There is again suggestion on the coronal imaging of anastomotic leak as a small-bowel loop and surgical anastomosis line appear to inter connect with the air fluid collection although no contrast is noted in this region to confirm this. More delayed imaging could be helpful. Continued drainage of the two fluid collections is recommended. The remainder of the exam is stable from the prior study. Electronically Signed   By: Alcide Clever M.D.   On: 03/12/2018 14:09    Anti-infectives: Anti-infectives (From admission, onward)   Start     Dose/Rate Route Frequency Ordered Stop   03/13/18 1600  Ampicillin-Sulbactam (UNASYN) 3 g in sodium chloride 0.9 % 100 mL IVPB     3 g 200 mL/hr over 30 Minutes Intravenous Every 6 hours 03/13/18 1538     03/07/18 1600  piperacillin-tazobactam (ZOSYN) IVPB 3.375 g  Status:  Discontinued     3.375 g 12.5 mL/hr over 240 Minutes Intravenous Every 8 hours 03/07/18 0910 03/13/18 1537   03/07/18 0930  piperacillin-tazobactam (ZOSYN) IVPB 3.375 g     3.375 g 100 mL/hr over 30 Minutes Intravenous  Once 03/07/18 0910 03/07/18 1104   03/01/18 1800  ceFAZolin (ANCEF) IVPB 2g/100 mL premix  Status:  Discontinued     2 g 200 mL/hr over 30 Minutes Intravenous Every 8 hours 03/01/18 1249 03/01/18 1405   03/01/18 1115  vancomycin (VANCOCIN) powder  Status:  Discontinued       As needed 03/01/18 1115 03/01/18 1238   03/01/18 0938  ceFAZolin (ANCEF) 2-4 GM/100ML-% IVPB    Note to Pharmacy:  Aquilla Hacker   : cabinet override      03/01/18 0938 03/01/18 2144   03/01/18 0600  ceFAZolin (ANCEF) IVPB 2g/100 mL premix  Status:  Discontinued     2 g 200 mL/hr over 30 Minutes Intravenous On call to O.R. 02/28/18 1815 02/28/18 1820   02/28/18 2100  cefoTEtan (CEFOTAN) 2 g in sodium chloride 0.9 % 100 mL IVPB  Status:  Discontinued      2 g 200 mL/hr over 30 Minutes Intravenous Every 12 hours 02/28/18 1339 02/28/18 1921   02/28/18 2100  cefoTEtan (CEFOTAN) 2 g in sodium chloride 0.9 % 100 mL IVPB  Status:  Discontinued     2 g 200 mL/hr over 30 Minutes Intravenous Every 12 hours 02/28/18 1916 02/28/18 1916   02/28/18 2100  cefoTEtan in Dextrose 5% (CEFOTAN) IVPB 2 g     2 g 100 mL/hr over 30 Minutes Intravenous Every 12 hours 02/28/18 1921 03/03/18 2159   02/28/18 0915  cefoTEtan in Dextrose 5% (CEFOTAN) IVPB 2 g  Status:  Discontinued     2 g 100 mL/hr over 30 Minutes Intravenous To Surgery 02/28/18 0907 02/28/18 1339   02/28/18 0900  cefoTEtan (CEFOTAN) 2 g in sodium chloride 0.9 % 100 mL IVPB     2 g 200 mL/hr over 30 Minutes Intravenous  Once 02/28/18 0816 02/28/18 0939      Assessment/Plan: s/p Procedure(s): EXPLORATORY LAPAROTOMY WITH SMALL BOWEL RESECTION AND ILEOSTOMY, PSAOAS WITH BOARIFLAP AND URETERAL REIMPLANTATION RIGHT. With ileostoy working, will clamp NGT and let patient continue to take clear liquids.  Hemoglobin down to 7.5  With his tachycardia I will give him one unit of blood.  LOS: 14 days   Marta LamasJames O. Gae BonWyatt, III, MD, FACS [phone] Trauma Surgeon 03/14/2018

## 2018-03-15 LAB — MAGNESIUM: Magnesium: 2 mg/dL (ref 1.7–2.4)

## 2018-03-15 LAB — CBC WITH DIFFERENTIAL/PLATELET
BASOS ABS: 0.3 10*3/uL — AB (ref 0.0–0.1)
Band Neutrophils: 3 %
Basophils Relative: 1 %
Blasts: 0 %
EOS ABS: 0 10*3/uL (ref 0.0–0.5)
Eosinophils Relative: 0 %
HCT: 26.3 % — ABNORMAL LOW (ref 39.0–52.0)
Hemoglobin: 8.1 g/dL — ABNORMAL LOW (ref 13.0–17.0)
LYMPHS PCT: 4 %
Lymphs Abs: 1 10*3/uL (ref 0.7–4.0)
MCH: 27 pg (ref 26.0–34.0)
MCHC: 30.8 g/dL (ref 30.0–36.0)
MCV: 87.7 fL (ref 80.0–100.0)
MONO ABS: 0.8 10*3/uL (ref 0.1–1.0)
MONOS PCT: 3 %
Metamyelocytes Relative: 0 %
Myelocytes: 5 %
NEUTROS PCT: 83 %
NRBC: 0.1 % (ref 0.0–0.2)
Neutro Abs: 23.6 10*3/uL — ABNORMAL HIGH (ref 1.7–7.7)
Other: 0 %
Platelets: 603 10*3/uL — ABNORMAL HIGH (ref 150–400)
Promyelocytes Relative: 1 %
RBC: 3 MIL/uL — AB (ref 4.22–5.81)
RDW: 13.7 % (ref 11.5–15.5)
WBC: 25.7 10*3/uL — AB (ref 4.0–10.5)
nRBC: 0 /100 WBC

## 2018-03-15 LAB — COMPREHENSIVE METABOLIC PANEL
ALBUMIN: 1.7 g/dL — AB (ref 3.5–5.0)
ALT: 44 U/L (ref 0–44)
ANION GAP: 8 (ref 5–15)
AST: 32 U/L (ref 15–41)
Alkaline Phosphatase: 172 U/L — ABNORMAL HIGH (ref 38–126)
BUN: 13 mg/dL (ref 6–20)
CO2: 23 mmol/L (ref 22–32)
Calcium: 8 mg/dL — ABNORMAL LOW (ref 8.9–10.3)
Chloride: 106 mmol/L (ref 98–111)
Creatinine, Ser: 0.47 mg/dL — ABNORMAL LOW (ref 0.61–1.24)
GFR calc Af Amer: >60 mL/min (ref >60)
GFR calc non Af Amer: >60 mL/min (ref >60)
GLUCOSE: 129 mg/dL — AB (ref 70–99)
POTASSIUM: 3.3 mmol/L — AB (ref 3.5–5.1)
SODIUM: 137 mmol/L (ref 135–145)
Total Bilirubin: 1.8 mg/dL — ABNORMAL HIGH (ref 0.3–1.2)
Total Protein: 5.8 g/dL — ABNORMAL LOW (ref 6.5–8.1)

## 2018-03-15 LAB — BPAM RBC
BLOOD PRODUCT EXPIRATION DATE: 201911182359
Blood Product Expiration Date: 201911182359
ISSUE DATE / TIME: 201911111558
ISSUE DATE / TIME: 201911130956
UNIT TYPE AND RH: 6200
Unit Type and Rh: 6200

## 2018-03-15 LAB — TYPE AND SCREEN
ABO/RH(D): A POS
Antibody Screen: NEGATIVE
UNIT DIVISION: 0
Unit division: 0

## 2018-03-15 LAB — GLUCOSE, CAPILLARY
GLUCOSE-CAPILLARY: 123 mg/dL — AB (ref 70–99)
GLUCOSE-CAPILLARY: 140 mg/dL — AB (ref 70–99)
Glucose-Capillary: 121 mg/dL — ABNORMAL HIGH (ref 70–99)
Glucose-Capillary: 156 mg/dL — ABNORMAL HIGH (ref 70–99)
Glucose-Capillary: 117 mg/dL — ABNORMAL HIGH (ref 70–99)
Glucose-Capillary: 110 mg/dL — ABNORMAL HIGH (ref 70–99)

## 2018-03-15 LAB — PHOSPHORUS: Phosphorus: 4.1 mg/dL (ref 2.5–4.6)

## 2018-03-15 MED ORDER — FUROSEMIDE 10 MG/ML IJ SOLN
20.0000 mg | Freq: Once | INTRAMUSCULAR | Status: AC
  Administered 2018-03-15: 20 mg via INTRAVENOUS
  Filled 2018-03-15: qty 2

## 2018-03-15 MED ORDER — ACETAMINOPHEN 160 MG/5ML PO SOLN: 650.0000 mg | Freq: Four times a day (QID) | ORAL | Status: DC

## 2018-03-15 MED ORDER — BOOST / RESOURCE BREEZE PO LIQD CUSTOM
1.0000 | Freq: Two times a day (BID) | ORAL | Status: DC
  Administered 2018-03-15 – 2018-03-18 (×4): 1 via ORAL

## 2018-03-15 MED ORDER — POTASSIUM CHLORIDE CRYS ER 20 MEQ PO TBCR
40.0000 meq | EXTENDED_RELEASE_TABLET | Freq: Once | ORAL | Status: AC
  Administered 2018-03-15: 40 meq via ORAL
  Filled 2018-03-15: qty 2

## 2018-03-15 MED ORDER — HYDROMORPHONE HCL 1 MG/ML IJ SOLN
1.0000 mg | INTRAMUSCULAR | Status: DC | PRN
  Administered 2018-03-15 – 2018-03-31 (×66): 1 mg via INTRAVENOUS
  Filled 2018-03-15 (×69): qty 1

## 2018-03-15 MED ORDER — PRO-STAT SUGAR FREE PO LIQD
30.0000 mL | Freq: Three times a day (TID) | ORAL | Status: DC
  Administered 2018-03-15 – 2018-03-20 (×12): 30 mL via ORAL
  Filled 2018-03-15 (×17): qty 30

## 2018-03-15 MED ORDER — ACETAMINOPHEN 325 MG PO TABS
650.0000 mg | ORAL_TABLET | Freq: Four times a day (QID) | ORAL | Status: DC
  Administered 2018-03-15 – 2018-03-31 (×60): 650 mg via ORAL
  Filled 2018-03-15 (×61): qty 2

## 2018-03-15 MED ORDER — OXYCODONE HCL 5 MG PO TABS
5.0000 mg | ORAL_TABLET | ORAL | Status: DC | PRN
  Administered 2018-03-15 – 2018-03-16 (×4): 10 mg via ORAL
  Filled 2018-03-15 (×4): qty 2

## 2018-03-15 MED ORDER — TRAVASOL 10 % IV SOLN
INTRAVENOUS | Status: AC
  Administered 2018-03-15: 17:00:00 via INTRAVENOUS
  Filled 2018-03-15: qty 1345.2

## 2018-03-15 MED ORDER — ACETAMINOPHEN 160 MG/5ML PO SOLN
ORAL | Status: AC
  Filled 2018-03-15: qty 20.3

## 2018-03-15 MED ORDER — METOPROLOL TARTRATE 25 MG PO TABS
25.0000 mg | ORAL_TABLET | Freq: Two times a day (BID) | ORAL | Status: DC
  Administered 2018-03-15 – 2018-03-31 (×33): 25 mg via ORAL
  Filled 2018-03-15 (×34): qty 1

## 2018-03-15 MED ORDER — SIMETHICONE 80 MG PO CHEW
80.0000 mg | CHEWABLE_TABLET | Freq: Four times a day (QID) | ORAL | Status: DC | PRN
  Administered 2018-03-15 – 2018-03-24 (×14): 80 mg via ORAL
  Filled 2018-03-15 (×14): qty 1
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
## 2018-03-15 NOTE — Progress Notes (Signed)
3 Days Post-Op  Subjective: The foley is draining well. The urine is light pink.   The wound drainage has continued to decrease and could be removed at the discretion of the trauma service.  He has some rib pain but no flank pain.  He is thirsty.  ROS:  ROS  Anti-infectives: Anti-infectives (From admission, onward)   Start     Dose/Rate Route Frequency Ordered Stop   03/13/18 1600  Ampicillin-Sulbactam (UNASYN) 3 g in sodium chloride 0.9 % 100 mL IVPB     3 g 200 mL/hr over 30 Minutes Intravenous Every 6 hours 03/13/18 1538     03/07/18 1600  piperacillin-tazobactam (ZOSYN) IVPB 3.375 g  Status:  Discontinued     3.375 g 12.5 mL/hr over 240 Minutes Intravenous Every 8 hours 03/07/18 0910 03/13/18 1537   03/07/18 0930  piperacillin-tazobactam (ZOSYN) IVPB 3.375 g     3.375 g 100 mL/hr over 30 Minutes Intravenous  Once 03/07/18 0910 03/07/18 1104   03/01/18 1800  ceFAZolin (ANCEF) IVPB 2g/100 mL premix  Status:  Discontinued     2 g 200 mL/hr over 30 Minutes Intravenous Every 8 hours 03/01/18 1249 03/01/18 1405   03/01/18 1115  vancomycin (VANCOCIN) powder  Status:  Discontinued       As needed 03/01/18 1115 03/01/18 1238   03/01/18 0938  ceFAZolin (ANCEF) 2-4 GM/100ML-% IVPB    Note to Pharmacy:  Grace Blight   : cabinet override      03/01/18 0938 03/01/18 2144   03/01/18 0600  ceFAZolin (ANCEF) IVPB 2g/100 mL premix  Status:  Discontinued     2 g 200 mL/hr over 30 Minutes Intravenous On call to O.R. 02/28/18 1815 02/28/18 1820   02/28/18 2100  cefoTEtan (CEFOTAN) 2 g in sodium chloride 0.9 % 100 mL IVPB  Status:  Discontinued     2 g 200 mL/hr over 30 Minutes Intravenous Every 12 hours 02/28/18 1339 02/28/18 1921   02/28/18 2100  cefoTEtan (CEFOTAN) 2 g in sodium chloride 0.9 % 100 mL IVPB  Status:  Discontinued     2 g 200 mL/hr over 30 Minutes Intravenous Every 12 hours 02/28/18 1916 02/28/18 1916   02/28/18 2100  cefoTEtan in Dextrose 5% (CEFOTAN) IVPB 2 g     2 g 100 mL/hr  over 30 Minutes Intravenous Every 12 hours 02/28/18 1921 03/03/18 2159   02/28/18 0915  cefoTEtan in Dextrose 5% (CEFOTAN) IVPB 2 g  Status:  Discontinued     2 g 100 mL/hr over 30 Minutes Intravenous To Surgery 02/28/18 0907 02/28/18 1339   02/28/18 0900  cefoTEtan (CEFOTAN) 2 g in sodium chloride 0.9 % 100 mL IVPB     2 g 200 mL/hr over 30 Minutes Intravenous  Once 02/28/18 0816 02/28/18 2426      Current Facility-Administered Medications  Medication Dose Route Frequency Provider Last Rate Last Dose  . 0.9 %  sodium chloride infusion   Intravenous PRN Judeth Horn, MD 10 mL/hr at 03/10/18 1628 500 mL at 03/10/18 1628  . 0.9 %  sodium chloride infusion   Intravenous Continuous Georganna Skeans, MD 75 mL/hr at 03/15/18 0700    . acetaminophen (TYLENOL) solution 650 mg  650 mg Per Tube Q6H Judeth Horn, MD   650 mg at 03/15/18 0419  . Ampicillin-Sulbactam (UNASYN) 3 g in sodium chloride 0.9 % 100 mL IVPB  3 g Intravenous Q6H Georganna Skeans, MD   Stopped at 03/15/18 [phone]  . bacitracin ointment   Topical  BID Judeth Horn, MD      . chlorhexidine gluconate (MEDLINE KIT) (PERIDEX) 0.12 % solution 15 mL  15 mL Mouth Rinse BID Judeth Horn, MD   15 mL at 03/14/18 2100  . Chlorhexidine Gluconate Cloth 2 % PADS 6 each  6 each Topical Q0600 Judeth Horn, MD   6 each at 03/14/18 0914  . diphenhydrAMINE (BENADRYL) injection 12.5 mg  12.5 mg Intravenous Q6H PRN Judeth Horn, MD       Or  . diphenhydrAMINE (BENADRYL) 12.5 MG/5ML elixir 12.5 mg  12.5 mg Oral Q6H PRN Judeth Horn, MD      . enoxaparin (LOVENOX) injection 40 mg  40 mg Subcutaneous Q24H Judeth Horn, MD   40 mg at 03/14/18 0914  . HYDROmorphone (DILAUDID) 1 mg/mL PCA injection   Intravenous Q4H Judeth Horn, MD   25 mg at 03/14/18 1045  . Influenza vac split quadrivalent PF (FLUARIX) injection 0.5 mL  0.5 mL Intramuscular Tomorrow-1000 Judeth Horn, MD      . insulin aspart (novoLOG) injection 0-15 Units  0-15 Units Subcutaneous Q4H Georganna Skeans, MD   2 Units at 03/15/18 0815  . lactated ringers infusion   Intravenous Continuous Judeth Horn, MD   Stopped at 03/12/18 1845  . MEDLINE mouth rinse  15 mL Mouth Rinse BID Judeth Horn, MD   15 mL at 03/14/18 0915  . menthol-cetylpyridinium (CEPACOL) lozenge 3 mg  1 lozenge Oral PRN Judeth Horn, MD      . methocarbamol (ROBAXIN) 1,000 mg in dextrose 5 % 50 mL IVPB  1,000 mg Intravenous Q8H Judeth Horn, MD 100 mL/hr at 03/15/18 0700    . methocarbamol (ROBAXIN) 1,000 mg in dextrose 5 % 50 mL IVPB  1,000 mg Intravenous Q8H PRN Georganna Skeans, MD   Stopped at 03/13/18 1005  . metoprolol tartrate (LOPRESSOR) injection 10 mg  10 mg Intravenous BID Judeth Horn, MD   10 mg at 03/14/18 2207  . metoprolol tartrate (LOPRESSOR) injection 5 mg  5 mg Intravenous Q3H PRN Donnie Mesa, MD   5 mg at 03/14/18 0359  . naloxone [facility]) injection 0.4 mg  0.4 mg Intravenous PRN Judeth Horn, MD       And  . sodium chloride flush (NS) 0.9 % injection 9 mL  9 mL Intravenous PRN Judeth Horn, MD   9 mL at 03/12/18 2307  . ondansetron (ZOFRAN) injection 4 mg  4 mg Intravenous Q6H PRN Judeth Horn, MD      . promethazine (PHENERGAN) injection 25 mg  25 mg Intravenous Q4H PRN Judeth Horn, MD   25 mg at 03/08/18 0124  . TPN ADULT (ION)   Intravenous Continuous TPN Karren Cobble, RPH 95 mL/hr at 03/15/18 0700       Objective: Vital signs in last 24 hours: Temp:  [98.3 F (36.8 C)-102.4 F (39.1 C)] 100.4 F (38 C) (11/14 0600) Pulse Rate:  [117-138] 125 (11/14 0700) Resp:  [13-26] 18 (11/14 0800) BP: (131-161)/(71-89) 161/89 (11/14 0700) SpO2:  [92 %-99 %] 99 % (11/14 0800)  Intake/Output from previous day: 11/13 0701 - 11/14 0700 In: 4851.3 [I.V.:3998.3; Blood:316; IV Piggyback:537.1] Out: 2330 [Urine:1610; Emesis/NG output:300; Drains:35; Stool:385] Intake/Output this shift: No intake/output data recorded.   Physical Exam  Constitutional: He appears well-developed and  well-nourished.  Vitals reviewed.   Lab Results:  Recent Labs    03/14/18 0457 03/14/18 1529 03/15/18 0414  WBC 28.5*  --  25.7*  HGB 7.5* 8.3* 8.1*  HCT 23.5* 26.6*  26.3*  PLT 609*  --  603*   BMET Recent Labs    03/14/18 0457 03/15/18 0414  NA 136 137  K 3.7 3.3*  CL 105 106  CO2 26 23  GLUCOSE 149* 129*  BUN 16 13  CREATININE 0.72 0.47*  CALCIUM 7.6* 8.0*   PT/INR No results for input(s): LABPROT, INR in the last 72 hours. ABG No results for input(s): PHART, HCO3 in the last 72 hours.  Invalid input(s): PCO2, PO2  Studies/Results: No results found.   Assessment and Plan: S/P right reimplant with psoas hitch and boari flap with pink urine and no flank pain.  Continue foley drainage.   He will need a cystogram 2-3 weeks post op prior to foley removal and cystoscopy with stent removal in 4-6 weeks.      LOS: 15 days    Irine Seal 03/15/2018 [phone] ID: Mali E Hanahan Jr., male   DOB: 07-19-1995, 22 y.o.   MRN: [id number]
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
## 2018-03-15 NOTE — Progress Notes (Signed)
Pt. Continuing to increase temperature going from 100.1 to 102.4 rectally.   Dr. Lindie SpruceWyatt approved to give another 650mg  of tylenol per tube but due to downtime it wouldn't allow me to place an order in the past. Given tylenol and will continue to monitor.   Sherrie GeorgeMegan Weylyn Ricciuti RN, BSN
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
## 2018-03-16 LAB — BASIC METABOLIC PANEL
ANION GAP: 10 (ref 5–15)
BUN: 14 mg/dL (ref 6–20)
CALCIUM: 7.9 mg/dL — AB (ref 8.9–10.3)
CHLORIDE: 103 mmol/L (ref 98–111)
CO2: 22 mmol/L (ref 22–32)
Creatinine, Ser: 0.64 mg/dL (ref 0.61–1.24)
GFR calc Af Amer: >60 mL/min (ref >60)
GFR calc non Af Amer: >60 mL/min (ref >60)
GLUCOSE: 123 mg/dL — AB (ref 70–99)
Potassium: 3.4 mmol/L — ABNORMAL LOW (ref 3.5–5.1)
Sodium: 135 mmol/L (ref 135–145)

## 2018-03-16 LAB — GLUCOSE, CAPILLARY
GLUCOSE-CAPILLARY: 124 mg/dL — AB (ref 70–99)
GLUCOSE-CAPILLARY: 144 mg/dL — AB (ref 70–99)
GLUCOSE-CAPILLARY: 116 mg/dL — AB (ref 70–99)
Glucose-Capillary: 109 mg/dL — ABNORMAL HIGH (ref 70–99)
Glucose-Capillary: 116 mg/dL — ABNORMAL HIGH (ref 70–99)
Glucose-Capillary: 118 mg/dL — ABNORMAL HIGH (ref 70–99)

## 2018-03-16 LAB — CBC
HEMATOCRIT: 26.6 % — AB (ref 39.0–52.0)
HEMOGLOBIN: 8.5 g/dL — AB (ref 13.0–17.0)
MCH: 27.7 pg (ref 26.0–34.0)
MCHC: 32 g/dL (ref 30.0–36.0)
MCV: 86.6 fL (ref 80.0–100.0)
NRBC: 0 % (ref 0.0–0.2)
Platelets: 530 10*3/uL — ABNORMAL HIGH (ref 150–400)
RBC: 3.07 MIL/uL — ABNORMAL LOW (ref 4.22–5.81)
RDW: 13.9 % (ref 11.5–15.5)
WBC: 22 10*3/uL — ABNORMAL HIGH (ref 4.0–10.5)

## 2018-03-16 MED ORDER — POTASSIUM CHLORIDE CRYS ER 20 MEQ PO TBCR
40.0000 meq | EXTENDED_RELEASE_TABLET | Freq: Once | ORAL | Status: AC
  Administered 2018-03-16: 40 meq via ORAL
  Filled 2018-03-16: qty 2

## 2018-03-16 MED ORDER — METHOCARBAMOL 500 MG PO TABS
1000.0000 mg | ORAL_TABLET | Freq: Three times a day (TID) | ORAL | Status: DC
  Administered 2018-03-16 – 2018-03-31 (×44): 1000 mg via ORAL
  Filled 2018-03-16 (×45): qty 2

## 2018-03-16 MED ORDER — OXYCODONE HCL 5 MG PO TABS
10.0000 mg | ORAL_TABLET | ORAL | Status: DC | PRN
  Administered 2018-03-16 – 2018-03-17 (×4): 15 mg via ORAL
  Administered 2018-03-18: 10 mg via ORAL
  Administered 2018-03-18 – 2018-03-20 (×7): 15 mg via ORAL
  Administered 2018-03-20: 10 mg via ORAL
  Administered 2018-03-20 – 2018-03-24 (×15): 15 mg via ORAL
  Administered 2018-03-25: 10 mg via ORAL
  Administered 2018-03-25 (×2): 15 mg via ORAL
  Administered 2018-03-25: 10 mg via ORAL
  Administered 2018-03-26 – 2018-03-31 (×18): 15 mg via ORAL
  Filled 2018-03-16 (×18): qty 3
  Filled 2018-03-16: qty 2
  Filled 2018-03-16 (×2): qty 3
  Filled 2018-03-16: qty 2
  Filled 2018-03-16 (×4): qty 3
  Filled 2018-03-16: qty 2
  Filled 2018-03-16 (×18): qty 3
  Filled 2018-03-16: qty 2
  Filled 2018-03-16: qty 3
  Filled 2018-03-16: qty 2
  Filled 2018-03-16 (×5): qty 3

## 2018-03-16 MED ORDER — TRAMADOL HCL 50 MG PO TABS
50.0000 mg | ORAL_TABLET | Freq: Four times a day (QID) | ORAL | Status: DC
  Administered 2018-03-16 – 2018-03-31 (×56): 50 mg via ORAL
  Filled 2018-03-16 (×59): qty 1

## 2018-03-16 MED ORDER — TRAVASOL 10 % IV SOLN
INTRAVENOUS | Status: AC
  Administered 2018-03-16: 19:00:00 via INTRAVENOUS
  Filled 2018-03-16: qty 1345.2
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
## 2018-03-16 NOTE — Progress Notes (Signed)
OT Cancellation Note  Patient Details Name: Richard E Elmendorf Jr. MRN: [id number] DOB: December 31, 1995   Cancelled Treatment:    Reason Eval/Treat Not Completed: Pain limiting ability to participate(Will attemtp again later as schedule allows.)  [facility] Liberty[facility],HILLARY  [name], OT/L   Acute OT Clinical Specialist Acute Rehabilitation Services Pager [phone] Office [phone]  03/16/2018, 11:56 AM

## 2018-03-16 NOTE — Progress Notes (Signed)
Pt complaining of pain.  PRN given and is not due.  Trauma team paged and will assess.  No new orders at this this.  RN will continue to monitor.
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
## 2018-03-16 NOTE — Progress Notes (Signed)
Report given to 4 NP RN. Pt transfer to 4 NP via recliner.  VSS. Pt is alert and orient.  Family at bedside.
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
## 2018-03-17 LAB — BASIC METABOLIC PANEL
ANION GAP: 9 (ref 5–15)
BUN: 14 mg/dL (ref 6–20)
CALCIUM: 8 mg/dL — AB (ref 8.9–10.3)
CHLORIDE: 103 mmol/L (ref 98–111)
CO2: 22 mmol/L (ref 22–32)
Creatinine, Ser: 0.63 mg/dL (ref 0.61–1.24)
GFR calc Af Amer: >60 mL/min (ref >60)
GFR calc non Af Amer: >60 mL/min (ref >60)
Glucose, Bld: 110 mg/dL — ABNORMAL HIGH (ref 70–99)
Potassium: 3.8 mmol/L (ref 3.5–5.1)
Sodium: 134 mmol/L — ABNORMAL LOW (ref 135–145)

## 2018-03-17 LAB — CBC
HEMATOCRIT: 26.5 % — AB (ref 39.0–52.0)
HEMOGLOBIN: 8.1 g/dL — AB (ref 13.0–17.0)
MCH: 26.9 pg (ref 26.0–34.0)
MCHC: 30.6 g/dL (ref 30.0–36.0)
MCV: 88 fL (ref 80.0–100.0)
NRBC: 0 % (ref 0.0–0.2)
Platelets: 550 10*3/uL — ABNORMAL HIGH (ref 150–400)
RBC: 3.01 MIL/uL — ABNORMAL LOW (ref 4.22–5.81)
RDW: 13.8 % (ref 11.5–15.5)
WBC: 20.7 10*3/uL — ABNORMAL HIGH (ref 4.0–10.5)

## 2018-03-17 LAB — GLUCOSE, CAPILLARY
GLUCOSE-CAPILLARY: 114 mg/dL — AB (ref 70–99)
GLUCOSE-CAPILLARY: 120 mg/dL — AB (ref 70–99)
GLUCOSE-CAPILLARY: 124 mg/dL — AB (ref 70–99)
GLUCOSE-CAPILLARY: 135 mg/dL — AB (ref 70–99)
Glucose-Capillary: 111 mg/dL — ABNORMAL HIGH (ref 70–99)
Glucose-Capillary: 124 mg/dL — ABNORMAL HIGH (ref 70–99)

## 2018-03-17 LAB — MAGNESIUM: Magnesium: 2 mg/dL (ref 1.7–2.4)

## 2018-03-17 MED ORDER — WHITE PETROLATUM EX OINT
TOPICAL_OINTMENT | CUTANEOUS | Status: AC
  Filled 2018-03-17: qty 28.35

## 2018-03-17 MED ORDER — TRAVASOL 10 % IV SOLN
INTRAVENOUS | Status: AC
  Administered 2018-03-17: 18:00:00 via INTRAVENOUS
  Filled 2018-03-17: qty 1345.2
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
## 2018-03-17 NOTE — Progress Notes (Signed)
Patient ID: Richard E Bert Jr., male   DOB: [birth year], 21 y.o.   MRN: [id number]    Assessment: S/P right ureteral injury treated with psoas hitch and Boari flap: His catheter is draining and his urine is clear he is not having any flank pain or suprapubic discomfort from his stent.  His urine output remains good with minimal drain output   Plan:  Continue Foley catheter drainage otherwise no new urologic recommendations.   Subjective: Patient reports no new complaints.  Objective: Vital signs in last 24 hours: Temp:  [97.5 F (36.4 C)-98.4 F (36.9 C)] 98 F (36.7 C) (11/16 0800) Pulse Rate:  [114-137] 124 (11/16 0938) Resp:  [16-33] 17 (11/16 0800) BP: (127-157)/(78-96) 127/81 (11/16 0800) SpO2:  [95 %-100 %] 96 % (11/16 0800)A  Intake/Output from previous day: 11/15 0701 - 11/16 0700 In: 2521.1 [I.V.:2321.1; IV Piggyback:200.1] Out: 3205 [Urine:2275; Stool:930] Intake/Output this shift: No intake/output data recorded.  History reviewed. No pertinent past medical history.  Physical Exam:  General: Awake, alert and in no apparent distress Lungs: Normal respiratory effort, chest expands symmetrically.  Abdomen: Wound packed healing by second intent.  Lab Results: Recent Labs    03/15/18 0414 03/16/18 0446 03/17/18 0222  WBC 25.7* 22.0* 20.7*  HGB 8.1* 8.5* 8.1*  HCT 26.3* 26.6* 26.5*   BMET Recent Labs    03/16/18 0446 03/17/18 0222  NA 135 134*  K 3.4* 3.8  CL 103 103  CO2 22 22  GLUCOSE 123* 110*  BUN 14 14  CREATININE 0.64 0.63  CALCIUM 7.9* 8.0*   No results for input(s): LABURIN in the last 72 hours. Results for orders placed or performed during the hospital encounter of 02/28/18  MRSA PCR Screening     Status: None   Collection Time: 02/28/18  2:01 PM  Result Value Ref Range Status   MRSA by PCR NEGATIVE NEGATIVE Final    Comment:        The GeneXpert MRSA Assay (FDA approved for NASAL specimens only), is one component of a comprehensive  MRSA colonization surveillance program. It is not intended to diagnose MRSA infection nor to guide or monitor treatment for MRSA infections. Performed at [facility], [address], [place], [place] [address]   Culture, Urine     Status: None   Collection Time: 03/06/18  8:41 AM  Result Value Ref Range Status   Specimen Description URINE, RANDOM  Final   Special Requests NONE  Final   Culture   Final    NO GROWTH Performed at [facility], [address] [place], [place] [address]    Report Status 03/07/2018 FINAL  Final  Culture, blood (routine x 2)     Status: None   Collection Time: 03/06/18  8:52 AM  Result Value Ref Range Status   Specimen Description BLOOD RIGHT ANTECUBITAL  Final   Special Requests   Final    BOTTLES DRAWN AEROBIC ONLY Blood Culture adequate volume   Culture   Final    NO GROWTH 5 DAYS Performed at [facility], [address], [place], [place] [address]    Report Status 03/11/2018 FINAL  Final  Culture, blood (routine x 2)     Status: None   Collection Time: 03/06/18  9:00 AM  Result Value Ref Range Status   Specimen Description BLOOD RIGHT HAND  Final   Special Requests   Final    BOTTLES DRAWN AEROBIC ONLY Blood Culture adequate volume  Culture   Final    NO GROWTH 5 DAYS Performed at [facility] [place] [facility], [address] [place], [place] [address]    Report Status 03/11/2018 FINAL  Final  Aerobic/Anaerobic Culture (surgical/deep wound)     Status: None   Collection Time: 03/08/18 11:41 AM  Result Value Ref Range Status   Specimen Description ABSCESS  Final   Special Requests DRAIN 1  Final   Gram Stain   Final    RARE WBC PRESENT, PREDOMINANTLY PMN ABUNDANT GRAM POSITIVE RODS ABUNDANT GRAM NEGATIVE RODS MODERATE GRAM POSITIVE COCCI    Culture   Final    MODERATE ESCHERICHIA COLI MODERATE ENTEROCOCCUS FAECALIS MODERATE BACTEROIDES VULGATUS BETA LACTAMASE POSITIVE Performed at [facility], [address] [place], [place] [address]    Report Status 03/12/2018 FINAL  Final   Organism ID, Bacteria ESCHERICHIA COLI  Final   Organism ID, Bacteria ENTEROCOCCUS FAECALIS  Final      Susceptibility   Escherichia coli - MIC*    AMPICILLIN 8 SENSITIVE Sensitive     CEFAZOLIN <=4 SENSITIVE Sensitive     CEFEPIME <=1 SENSITIVE Sensitive     CEFTAZIDIME <=1 SENSITIVE Sensitive     CEFTRIAXONE <=1 SENSITIVE Sensitive     CIPROFLOXACIN <=0.25 SENSITIVE Sensitive     GENTAMICIN <=1 SENSITIVE Sensitive     IMIPENEM <=0.25 SENSITIVE Sensitive     TRIMETH/SULFA <=20 SENSITIVE Sensitive     AMPICILLIN/SULBACTAM 4 SENSITIVE Sensitive     PIP/TAZO <=4 SENSITIVE Sensitive     Extended ESBL NEGATIVE Sensitive     * MODERATE ESCHERICHIA COLI   Enterococcus faecalis - MIC*    AMPICILLIN <=2 SENSITIVE Sensitive     VANCOMYCIN 1 SENSITIVE Sensitive     GENTAMICIN SYNERGY SENSITIVE Sensitive     * MODERATE ENTEROCOCCUS FAECALIS  Aerobic/Anaerobic Culture (surgical/deep wound)     Status: None   Collection Time: 03/08/18 11:42 AM  Result Value Ref Range Status   Specimen Description PELVIS  Final   Special Requests DRAIN 2  Final   Gram Stain   Final    FEW WBC PRESENT,BOTH PMN AND MONONUCLEAR RARE GRAM POSITIVE RODS RARE GRAM POSITIVE COCCI    Culture   Final    MODERATE ENTEROCOCCUS FAECALIS MODERATE BACTEROIDES VULGATUS BETA LACTAMASE POSITIVE Performed at [facility], [address] [place], [place] [address]    Report Status 03/12/2018 FINAL  Final   Organism ID, Bacteria ENTEROCOCCUS FAECALIS  Final      Susceptibility   Enterococcus faecalis - MIC*    AMPICILLIN <=2 SENSITIVE Sensitive     VANCOMYCIN <=0.5 SENSITIVE Sensitive     GENTAMICIN SYNERGY SENSITIVE Sensitive     * MODERATE ENTEROCOCCUS FAECALIS    Studies/Results: No results found.    Danyetta Gillham C 03/17/2018, 11:24 AM

## 2018-03-18 LAB — GLUCOSE, CAPILLARY
GLUCOSE-CAPILLARY: 117 mg/dL — AB (ref 70–99)
GLUCOSE-CAPILLARY: 115 mg/dL — AB (ref 70–99)
Glucose-Capillary: 131 mg/dL — ABNORMAL HIGH (ref 70–99)
Glucose-Capillary: 112 mg/dL — ABNORMAL HIGH (ref 70–99)
Glucose-Capillary: 124 mg/dL — ABNORMAL HIGH (ref 70–99)

## 2018-03-18 LAB — BASIC METABOLIC PANEL
ANION GAP: 11 (ref 5–15)
BUN: 15 mg/dL (ref 6–20)
CALCIUM: 8.2 mg/dL — AB (ref 8.9–10.3)
CO2: 22 mmol/L (ref 22–32)
Chloride: 104 mmol/L (ref 98–111)
Creatinine, Ser: 0.63 mg/dL (ref 0.61–1.24)
GFR calc Af Amer: >60 mL/min (ref >60)
GFR calc non Af Amer: >60 mL/min (ref >60)
Glucose, Bld: 134 mg/dL — ABNORMAL HIGH (ref 70–99)
POTASSIUM: 4 mmol/L (ref 3.5–5.1)
Sodium: 137 mmol/L (ref 135–145)

## 2018-03-18 LAB — CBC
HCT: 26.3 % — ABNORMAL LOW (ref 39.0–52.0)
HEMOGLOBIN: 8.6 g/dL — AB (ref 13.0–17.0)
MCH: 27.9 pg (ref 26.0–34.0)
MCHC: 32.7 g/dL (ref 30.0–36.0)
MCV: 85.4 fL (ref 80.0–100.0)
NRBC: 0 % (ref 0.0–0.2)
Platelets: 540 10*3/uL — ABNORMAL HIGH (ref 150–400)
RBC: 3.08 MIL/uL — AB (ref 4.22–5.81)
RDW: 13.8 % (ref 11.5–15.5)
WBC: 20.6 10*3/uL — ABNORMAL HIGH (ref 4.0–10.5)

## 2018-03-18 MED ORDER — TRAVASOL 10 % IV SOLN
INTRAVENOUS | Status: DC
  Administered 2018-03-18: 18:00:00 via INTRAVENOUS
  Filled 2018-03-18: qty 1345.2
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
## 2018-03-19 LAB — COMPREHENSIVE METABOLIC PANEL
ALT: 68 U/L — ABNORMAL HIGH (ref 0–44)
AST: 46 U/L — AB (ref 15–41)
Albumin: 1.9 g/dL — ABNORMAL LOW (ref 3.5–5.0)
Alkaline Phosphatase: 266 U/L — ABNORMAL HIGH (ref 38–126)
Anion gap: 8 (ref 5–15)
BUN: 17 mg/dL (ref 6–20)
CHLORIDE: 105 mmol/L (ref 98–111)
CO2: 23 mmol/L (ref 22–32)
Calcium: 8.3 mg/dL — ABNORMAL LOW (ref 8.9–10.3)
Creatinine, Ser: 0.7 mg/dL (ref 0.61–1.24)
GFR calc Af Amer: >60 mL/min (ref >60)
Glucose, Bld: 115 mg/dL — ABNORMAL HIGH (ref 70–99)
POTASSIUM: 4.1 mmol/L (ref 3.5–5.1)
Sodium: 136 mmol/L (ref 135–145)
Total Bilirubin: 2.1 mg/dL — ABNORMAL HIGH (ref 0.3–1.2)
Total Protein: 7 g/dL (ref 6.5–8.1)

## 2018-03-19 LAB — GLUCOSE, CAPILLARY
GLUCOSE-CAPILLARY: 109 mg/dL — AB (ref 70–99)
GLUCOSE-CAPILLARY: 115 mg/dL — AB (ref 70–99)
GLUCOSE-CAPILLARY: 119 mg/dL — AB (ref 70–99)
GLUCOSE-CAPILLARY: 125 mg/dL — AB (ref 70–99)
GLUCOSE-CAPILLARY: 108 mg/dL — AB (ref 70–99)
Glucose-Capillary: 118 mg/dL — ABNORMAL HIGH (ref 70–99)
Glucose-Capillary: 103 mg/dL — ABNORMAL HIGH (ref 70–99)

## 2018-03-19 LAB — TRIGLYCERIDES: Triglycerides: 104 mg/dL (ref <150)

## 2018-03-19 LAB — PREALBUMIN: Prealbumin: 15.2 mg/dL — ABNORMAL LOW (ref 18–38)

## 2018-03-19 LAB — PHOSPHORUS: Phosphorus: 5.6 mg/dL — ABNORMAL HIGH (ref 2.5–4.6)

## 2018-03-19 LAB — MAGNESIUM: MAGNESIUM: 1.9 mg/dL (ref 1.7–2.4)

## 2018-03-19 MED ORDER — ENSURE ENLIVE PO LIQD: 237.0000 mL | Freq: Three times a day (TID) | ORAL | Status: DC

## 2018-03-19 MED ORDER — TRAVASOL 10 % IV SOLN: INTRAVENOUS | Status: DC

## 2018-03-19 MED ORDER — PROMETHAZINE HCL 25 MG/ML IJ SOLN
12.5000 mg | INTRAMUSCULAR | Status: DC | PRN
  Filled 2018-03-19 (×2): qty 1

## 2018-03-19 MED ORDER — TRAVASOL 10 % IV SOLN: INTRAVENOUS | Status: AC

## 2018-03-19 MED ORDER — TRAVASOL 10 % IV SOLN
INTRAVENOUS | Status: AC
  Administered 2018-03-19: 18:00:00 via INTRAVENOUS
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
## 2018-03-19 NOTE — Progress Notes (Signed)
Patient ID: Richard E Keelin Jr., male   DOB: 15-Jul-1995, 22 y.o.   MRN: [id number] 7 Days Post-Op  Subjective: His foley is draining well and the urine remains clear.  JP drainage scant.  He has no right flank pain.  .  ROS:  ROS  Anti-infectives: Anti-infectives (From admission, onward)   Start     Dose/Rate Route Frequency Ordered Stop   03/13/18 1600  Ampicillin-Sulbactam (UNASYN) 3 g in sodium chloride 0.9 % 100 mL IVPB     3 g 200 mL/hr over 30 Minutes Intravenous Every 6 hours 03/13/18 1538     03/07/18 1600  piperacillin-tazobactam (ZOSYN) IVPB 3.375 g  Status:  Discontinued     3.375 g 12.5 mL/hr over 240 Minutes Intravenous Every 8 hours 03/07/18 0910 03/13/18 1537   03/07/18 0930  piperacillin-tazobactam (ZOSYN) IVPB 3.375 g     3.375 g 100 mL/hr over 30 Minutes Intravenous  Once 03/07/18 0910 03/07/18 1104   03/01/18 1800  ceFAZolin (ANCEF) IVPB 2g/100 mL premix  Status:  Discontinued     2 g 200 mL/hr over 30 Minutes Intravenous Every 8 hours 03/01/18 1249 03/01/18 1405   03/01/18 1115  vancomycin (VANCOCIN) powder  Status:  Discontinued       As needed 03/01/18 1115 03/01/18 1238   03/01/18 0938  ceFAZolin (ANCEF) 2-4 GM/100ML-% IVPB    Note to Pharmacy:  Aquilla Hacker   : cabinet override      03/01/18 0938 03/01/18 2144   03/01/18 0600  ceFAZolin (ANCEF) IVPB 2g/100 mL premix  Status:  Discontinued     2 g 200 mL/hr over 30 Minutes Intravenous On call to O.R. 02/28/18 1815 02/28/18 1820   02/28/18 2100  cefoTEtan (CEFOTAN) 2 g in sodium chloride 0.9 % 100 mL IVPB  Status:  Discontinued     2 g 200 mL/hr over 30 Minutes Intravenous Every 12 hours 02/28/18 1339 02/28/18 1921   02/28/18 2100  cefoTEtan (CEFOTAN) 2 g in sodium chloride 0.9 % 100 mL IVPB  Status:  Discontinued     2 g 200 mL/hr over 30 Minutes Intravenous Every 12 hours 02/28/18 1916 02/28/18 1916   02/28/18 2100  cefoTEtan in Dextrose 5% (CEFOTAN) IVPB 2 g     2 g 100 mL/hr over 30 Minutes Intravenous  Every 12 hours 02/28/18 1921 03/03/18 2159   02/28/18 0915  cefoTEtan in Dextrose 5% (CEFOTAN) IVPB 2 g  Status:  Discontinued     2 g 100 mL/hr over 30 Minutes Intravenous To Surgery 02/28/18 0907 02/28/18 1339   02/28/18 0900  cefoTEtan (CEFOTAN) 2 g in sodium chloride 0.9 % 100 mL IVPB     2 g 200 mL/hr over 30 Minutes Intravenous  Once 02/28/18 0816 02/28/18 4098      Current Facility-Administered Medications  Medication Dose Route Frequency Provider Last Rate Last Dose  . 0.9 %  sodium chloride infusion   Intravenous PRN Jimmye Norman, MD 10 mL/hr at 03/10/18 1628 500 mL at 03/10/18 1628  . acetaminophen (TYLENOL) tablet 650 mg  650 mg Oral Q6H Scarlett Presto, RPH   650 mg at 03/19/18 1735  . Ampicillin-Sulbactam (UNASYN) 3 g in sodium chloride 0.9 % 100 mL IVPB  3 g Intravenous Q6H [name], MD 200 mL/hr at 03/19/18 1537 3 g at 03/19/18 1537  . bacitracin ointment   Topical BID Jimmye Norman, MD      . Chlorhexidine Gluconate Cloth 2 % PADS 6 each  6 each Topical  [id number] Jimmye NormanWyatt, James, MD   6 each at 03/19/18 1537  . diphenhydrAMINE (BENADRYL) injection 12.5 mg  12.5 mg Intravenous Q6H PRN Jimmye NormanWyatt, James, MD       Or  . diphenhydrAMINE (BENADRYL) 12.5 MG/5ML elixir 12.5 mg  12.5 mg Oral Q6H PRN Jimmye NormanWyatt, James, MD      . enoxaparin (LOVENOX) injection 40 mg  40 mg Subcutaneous Q24H Jimmye NormanWyatt, James, MD   40 mg at 03/19/18 0925  . feeding supplement (ENSURE ENLIVE) (ENSURE ENLIVE) liquid 237 mL  237 mL Oral TID BM Barnetta Chapelsborne, Kelly, PA-C      . feeding supplement (PRO-STAT SUGAR FREE 64) liquid 30 mL  30 mL Oral TID BM Violeta Gelinashompson, Burke, MD   30 mL at 03/19/18 1403  . HYDROmorphone (DILAUDID) injection 1 mg  1 mg Intravenous Q3H PRN Violeta Gelinashompson, Burke, MD   1 mg at 03/19/18 1536  . Influenza vac split quadrivalent PF (FLUARIX) injection 0.5 mL  0.5 mL Intramuscular Tomorrow-1000 Jimmye NormanWyatt, James, MD      . insulin aspart (novoLOG) injection 0-15 Units  0-15 Units Subcutaneous Q4H Violeta Gelinashompson, Burke, MD   2  Units at 03/18/18 1234  . lactated ringers infusion   Intravenous Continuous Jimmye NormanWyatt, James, MD   Stopped at 03/12/18 1845  . menthol-cetylpyridinium (CEPACOL) lozenge 3 mg  1 lozenge Oral PRN Jimmye NormanWyatt, James, MD      . methocarbamol (ROBAXIN) tablet 1,000 mg  1,000 mg Oral TID Focht, Jessica L, PA   1,000 mg at 03/19/18 1536  . metoprolol tartrate (LOPRESSOR) injection 5 mg  5 mg Intravenous Q3H PRN Manus Ruddsuei, Matthew, MD   5 mg at 03/19/18 1358  . metoprolol tartrate (LOPRESSOR) tablet 25 mg  25 mg Oral BID Violeta Gelinashompson, Burke, MD   25 mg at 03/19/18 0929  . naloxone [facility](NARCAN) injection 0.4 mg  0.4 mg Intravenous PRN Jimmye NormanWyatt, James, MD       And  . sodium chloride flush (NS) 0.9 % injection 9 mL  9 mL Intravenous PRN Jimmye NormanWyatt, James, MD   9 mL at 03/12/18 2307  . ondansetron (ZOFRAN) injection 4 mg  4 mg Intravenous Q6H PRN Jimmye NormanWyatt, James, MD      . oxyCODONE (Oxy IR/ROXICODONE) immediate release tablet 10-15 mg  10-15 mg Oral Q4H PRN Focht, Jessica L, PA   15 mg at 03/19/18 1632  . promethazine (PHENERGAN) injection 25 mg  25 mg Intravenous Q4H PRN Jimmye NormanWyatt, James, MD   25 mg at 03/08/18 0124  . simethicone (MYLICON) chewable tablet 80 mg  80 mg Oral QID PRN Violeta Gelinashompson, Burke, MD   80 mg at 03/19/18 1741  . TPN ADULT (ION)   Intravenous Continuous TPN Sampson SiMancheril, Benjamin G, RPH 50 mL/hr at 03/19/18 1734    . traMADol (ULTRAM) tablet 50 mg  50 mg Oral Q6H Focht, Jessica L, PA   50 mg at 03/19/18 1735     Objective: Vital signs in last 24 hours: Temp:  [97.9 F (36.6 C)-99.4 F (37.4 C)] 98.7 F (37.1 C) (11/18 1540) Pulse Rate:  [106-146] 123 (11/18 1540) Resp:  [16-23] 21 (11/18 1540) BP: (130-148)/(79-95) 144/95 (11/18 1540) SpO2:  [96 %-99 %] 97 % (11/18 1540)  Intake/Output from previous day: 11/17 0701 - 11/18 0700 In: 1085 [P.O.:110; I.V.:760; IV Piggyback:200] Out: 3017 [Urine:2250; Drains:17; Stool:750] Intake/Output this shift: Total I/O In: 2441.4 [P.O.:290; I.V.:1741.4; Other:10; IV  Piggyback:400] Out: 2275 [Urine:2050; Stool:225]   Physical Exam  Constitutional: He appears well-developed and well-nourished.  Genitourinary:  Genitourinary Comments: Urine  clear in foley bag.   Vitals reviewed.   Lab Results:  Recent Labs    03/17/18 0222 03/18/18 0228  WBC 20.7* 20.6*  HGB 8.1* 8.6*  HCT 26.5* 26.3*  PLT 550* 540*   BMET Recent Labs    03/18/18 0228 03/19/18 0510  NA 137 136  K 4.0 4.1  CL 104 105  CO2 22 23  GLUCOSE 134* 115*  BUN 15 17  CREATININE 0.63 0.70  CALCIUM 8.2* 8.3*   PT/INR No results for input(s): LABPROT, INR in the last 72 hours. ABG No results for input(s): PHART, HCO3 in the last 72 hours.  Invalid input(s): PCO2, PO2  Studies/Results: No results found.   Assessment and Plan: S/P right ureteral reimplant with psoas hitch and boari flap.  Doing well post op.  Cystogram prior to foley removal in 1-2 weeks.         LOS: 19 days    Bjorn Pippin 03/19/2018 [phone]
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
## 2018-03-20 ENCOUNTER — Inpatient Hospital Stay (HOSPITAL_COMMUNITY): Payer: BLUE CROSS/BLUE SHIELD

## 2018-03-20 LAB — CBC
HCT: 27 % — ABNORMAL LOW (ref 39.0–52.0)
Hemoglobin: 8.5 g/dL — ABNORMAL LOW (ref 13.0–17.0)
MCH: 27.4 pg (ref 26.0–34.0)
MCHC: 31.5 g/dL (ref 30.0–36.0)
MCV: 87.1 fL (ref 80.0–100.0)
NRBC: 0 % (ref 0.0–0.2)
PLATELETS: 568 10*3/uL — AB (ref 150–400)
RBC: 3.1 MIL/uL — AB (ref 4.22–5.81)
RDW: 13.8 % (ref 11.5–15.5)
WBC: 21.5 10*3/uL — AB (ref 4.0–10.5)

## 2018-03-20 LAB — GLUCOSE, CAPILLARY
GLUCOSE-CAPILLARY: 148 mg/dL — AB (ref 70–99)
GLUCOSE-CAPILLARY: 101 mg/dL — AB (ref 70–99)
GLUCOSE-CAPILLARY: 113 mg/dL — AB (ref 70–99)
Glucose-Capillary: 118 mg/dL — ABNORMAL HIGH (ref 70–99)

## 2018-03-20 LAB — BASIC METABOLIC PANEL
ANION GAP: 11 (ref 5–15)
BUN: 19 mg/dL (ref 6–20)
CO2: 25 mmol/L (ref 22–32)
Calcium: 8.7 mg/dL — ABNORMAL LOW (ref 8.9–10.3)
Chloride: 100 mmol/L (ref 98–111)
Creatinine, Ser: 0.65 mg/dL (ref 0.61–1.24)
GLUCOSE: 122 mg/dL — AB (ref 70–99)
POTASSIUM: 4 mmol/L (ref 3.5–5.1)
Sodium: 136 mmol/L (ref 135–145)

## 2018-03-20 MED ORDER — TRAVASOL 10 % IV SOLN
INTRAVENOUS | Status: AC
  Administered 2018-03-20: 17:00:00 via INTRAVENOUS

## 2018-03-20 MED ORDER — IOHEXOL 300 MG/ML  SOLN
100.0000 mL | Freq: Once | INTRAMUSCULAR | Status: AC | PRN
  Administered 2018-03-20: 100 mL via INTRAVENOUS
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
## 2018-03-20 NOTE — Progress Notes (Signed)
Pt not tolerating the Prostat well. States that the taste is too awful. Pt has refused several doses and struggles to finish accepted doses.
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
## 2018-03-20 NOTE — Progress Notes (Signed)
OT Cancellation Note  Patient Details Name: Richard E Dillin Jr. MRN: [id number] DOB: [birth year]   Cancelled Treatment:    Reason Eval/Treat Not Completed: Other (comment). Visitors present. Worked with PT earlier. Requesitng for OT to come tomorrow. Will attempt tomorrow.   Thornell MuleWARD,HILLARY  Calla Wedekind, OT/L   Acute OT Clinical Specialist Acute Rehabilitation Services Pager [phone] Office [phone]  03/20/2018, 6:11 PM

## 2018-03-20 NOTE — Consult Note (Addendum)
WOC Nurse ostomy follow up Surgical team following for assessment and plan of care to abd wound.   Pouch change demonstration performed with patient and girlfriend at the bedside. Current pouch is leaking behind the barrier. Stoma type/location:RLQ, ileostomy Stomal assessment/size:1 3/4" slightly oval stoma, below skin level, 100% red and moist,  Peristomal assessment:intact skin surrounding Treatment options for stomal/peristomal skin:appliedbarrier ringto attempt to maintain a seal. Output: modbrown liquid stool Ostomy pouching:1pc  Education provided:Performed pouch changeand barrier ring, using one piece flexible convex pouch and barrier ring.  Extra supplies and educational materials at the bedside. [place] [facility] [place]: Yes, previously WOC nurse will follow along with you for continued ostomyand wound care support. Cammie Mcgeeawn Rushawn Capshaw MSN, RN, CWOCN, [place]AP, CNS [phone]

## 2018-03-20 NOTE — Progress Notes (Addendum)
Calorie Count Note  48 hour calorie count ordered.  Diet: Regular Supplements: Ensure Enlive po TID, each supplement provides 350 kcal and 20 grams of protein; 30 ml Prostat TID, each supplement provides 100 kcals and 15 grams protein; TPN @ 50 ml/hr, which provides 1269 kcals, 71 grams protein  11/18 Breakfast: 196 kcals, 8 grams protein Lunch: 50 kcals, 8 grams protein Dinner: 90 kcals, 14 grams protein Supplements: refusing Ensure supplements per MAR  Total intake: 336 kcal (14% of minimum estimated needs)  30 grams protein (24% of minimum estimated needs)  Total nutritional intake (PO's + TPN): 1605 kcal (67% of minimum estimated needs)  67 grams protein (54% of minimum estimated needs)  Nutrition Dx: Increased nutrient needs related to post-op healing as evidenced by estimated needs; ongoing  Goal: Patient will meet greater than or equal to 90% of their needs; progressing  Intervention:   -Continue calorie count (evaluate on 03/21/18) -Continue 30 ml Prostat TID, each supplement provides 100 kcals and 15 grams protein -Continue Ensure Enlive po TID, each supplement provides 350 kcal and 20 grams of protein -Magic Cup BID with meals, each supplement provides 290 kcals and 9 grams protein -TPN management per pharmacy  Sharece Fleischhacker A. Mayford KnifeWilliams, RD, LDN, CDE Pager: [phone] After hours Pager: [phone]

## 2018-03-21 LAB — BASIC METABOLIC PANEL
Anion gap: 12 (ref 5–15)
BUN: 18 mg/dL (ref 6–20)
CALCIUM: 8.4 mg/dL — AB (ref 8.9–10.3)
CO2: 23 mmol/L (ref 22–32)
CREATININE: 0.68 mg/dL (ref 0.61–1.24)
Chloride: 98 mmol/L (ref 98–111)
GFR calc Af Amer: >60 mL/min (ref >60)
GLUCOSE: 114 mg/dL — AB (ref 70–99)
POTASSIUM: 3.9 mmol/L (ref 3.5–5.1)
SODIUM: 133 mmol/L — AB (ref 135–145)

## 2018-03-21 LAB — PHOSPHORUS: PHOSPHORUS: 5.2 mg/dL — AB (ref 2.5–4.6)

## 2018-03-21 LAB — GLUCOSE, CAPILLARY
GLUCOSE-CAPILLARY: 119 mg/dL — AB (ref 70–99)
GLUCOSE-CAPILLARY: 106 mg/dL — AB (ref 70–99)
GLUCOSE-CAPILLARY: 108 mg/dL — AB (ref 70–99)
Glucose-Capillary: 117 mg/dL — ABNORMAL HIGH (ref 70–99)

## 2018-03-21 MED ORDER — INSULIN ASPART 100 UNIT/ML ~~LOC~~ SOLN
0.0000 [IU] | Freq: Three times a day (TID) | SUBCUTANEOUS | Status: DC
  Administered 2018-03-22 (×2): 2 [IU] via SUBCUTANEOUS

## 2018-03-21 MED ORDER — ENOXAPARIN SODIUM 40 MG/0.4ML ~~LOC~~ SOLN
40.0000 mg | SUBCUTANEOUS | Status: AC
  Administered 2018-03-22 – 2018-03-27 (×5): 40 mg via SUBCUTANEOUS
  Filled 2018-03-21 (×6): qty 0.4

## 2018-03-21 MED ORDER — TRAVASOL 10 % IV SOLN
INTRAVENOUS | Status: DC
  Administered 2018-03-21: 18:00:00 via INTRAVENOUS

## 2018-03-21 NOTE — Progress Notes (Signed)
Calorie Count Note  48 hour calorie count ordered.  Diet: regular Supplements: Ensure Enlive po TID, each supplement provides 350 kcal and 20 grams of protein; 30 ml Prostat TID, each supplement provides 100 kcals and 15 grams protein; Magic Cup BID with meals, each supplement provides 290 kcals and 9 grams protein; TPN at 50 mL/hr (1296 kcals and 37 grams protein- approximately 53% of needs)   Per IR notes, repeat CT shows multiple new fluid collections. Pt NPO for potential additional aspiration and drains today.   11/18 Breakfast: 196 kcals, 8 grams protein Lunch: 50 kcals, 8 grams protein Dinner: 90 kcals, 14 grams protein Supplements: refusing Ensure supplements per MAR  Total intake: 336 kcal (14% of minimum estimated needs)  30 grams protein (24% of minimum estimated needs)  Total nutritional intake (PO's + TPN): 1605 kcal (67% of minimum estimated needs)  67 grams protein (54% of minimum estimated needs)  11/19 Breakfast: 97 kcals, 8 grams protein Lunch: 84 kcals and 5 grams protein Dinner: 0 kcals (diet coke) Supplements: refusing Ensure supplements, per RN, pt also refused PM dose of Prostat- does not like the flavor  Total intake: 181 kcal (7.5% of minimum estimated needs)  13 grams protein (10% of minimum estimated needs)  Total nutritional intake (PO's + TPN): 1477 kcal (62% of minimum estimated needs)  50 grams protein (40% of minimum estimated needs)  Average Total intake: 259 kcal (11% of minimum estimated needs)  22 grams protein (18% of minimum estimated needs)  Average Total nutritional intake (PO's + TPN): 1541 kcal (64% of minimum estimated needs)  59 grams protein (47% of minimum estimated needs)  Nutrition Dx: Increased nutrient needsrelated to post-op healingas evidenced by estimated needs; ongoing  Goal: Patient will meet greater than or equal to 90% of their needs; progressing  Intervention:   -D/c Prostat TID, due to poor  acceptance -D/c Ensure Enlive po TID, due to poor acceptance -TPN management per pharmacy -Continue magic Cup TID once diet is resumed  Richard [name], RD, LDN, CDE Pager: [phone] After hours Pager: [phone]
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
## 2018-03-21 NOTE — Progress Notes (Signed)
Pt tolerated dressing changes well when adequately premedicated. Pt is tolerating PO intake of fluids better.
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
## 2018-03-21 NOTE — Progress Notes (Signed)
9 Days Post-Op  Subjective: He is 9 days out from right reimplant with psoas hitch and boari flap.  He has no flank pain and has adequate urine output.  Cr is normal.  He had a CT on 03/20/18 that shows increase in the pelvic fluid collection that is adjacent to the bladder and the repair but the presence of a urine leak can't be determined.  The foley is in the decompressed bladder and the stent is in good position.  There is no hydronephrosis.  He is to have drains placed tomorrow.  ROS:  ROS  Anti-infectives: Anti-infectives (From admission, onward)   Start     Dose/Rate Route Frequency Ordered Stop   03/13/18 1600  Ampicillin-Sulbactam (UNASYN) 3 g in sodium chloride 0.9 % 100 mL IVPB     3 g 200 mL/hr over 30 Minutes Intravenous Every 6 hours 03/13/18 1538     03/07/18 1600  piperacillin-tazobactam (ZOSYN) IVPB 3.375 g  Status:  Discontinued     3.375 g 12.5 mL/hr over 240 Minutes Intravenous Every 8 hours 03/07/18 0910 03/13/18 1537   03/07/18 0930  piperacillin-tazobactam (ZOSYN) IVPB 3.375 g     3.375 g 100 mL/hr over 30 Minutes Intravenous  Once 03/07/18 0910 03/07/18 1104   03/01/18 1800  ceFAZolin (ANCEF) IVPB 2g/100 mL premix  Status:  Discontinued     2 g 200 mL/hr over 30 Minutes Intravenous Every 8 hours 03/01/18 1249 03/01/18 1405   03/01/18 1115  vancomycin (VANCOCIN) powder  Status:  Discontinued       As needed 03/01/18 1115 03/01/18 1238   03/01/18 0938  ceFAZolin (ANCEF) 2-4 GM/100ML-% IVPB    Note to Pharmacy:  Aquilla Hacker   : cabinet override      03/01/18 0938 03/01/18 2144   03/01/18 0600  ceFAZolin (ANCEF) IVPB 2g/100 mL premix  Status:  Discontinued     2 g 200 mL/hr over 30 Minutes Intravenous On call to O.R. 02/28/18 1815 02/28/18 1820   02/28/18 2100  cefoTEtan (CEFOTAN) 2 g in sodium chloride 0.9 % 100 mL IVPB  Status:  Discontinued     2 g 200 mL/hr over 30 Minutes Intravenous Every 12 hours 02/28/18 1339 02/28/18 1921   02/28/18 2100  cefoTEtan  (CEFOTAN) 2 g in sodium chloride 0.9 % 100 mL IVPB  Status:  Discontinued     2 g 200 mL/hr over 30 Minutes Intravenous Every 12 hours 02/28/18 1916 02/28/18 1916   02/28/18 2100  cefoTEtan in Dextrose 5% (CEFOTAN) IVPB 2 g     2 g 100 mL/hr over 30 Minutes Intravenous Every 12 hours 02/28/18 1921 03/03/18 2159   02/28/18 0915  cefoTEtan in Dextrose 5% (CEFOTAN) IVPB 2 g  Status:  Discontinued     2 g 100 mL/hr over 30 Minutes Intravenous To Surgery 02/28/18 0907 02/28/18 1339   02/28/18 0900  cefoTEtan (CEFOTAN) 2 g in sodium chloride 0.9 % 100 mL IVPB     2 g 200 mL/hr over 30 Minutes Intravenous  Once 02/28/18 0816 02/28/18 1610      Current Facility-Administered Medications  Medication Dose Route Frequency Provider Last Rate Last Dose  . 0.9 %  sodium chloride infusion   Intravenous PRN Gaynelle Adu, MD 10 mL/hr at 03/10/18 1628 500 mL at 03/10/18 1628  . acetaminophen (TYLENOL) tablet 650 mg  650 mg Oral Q6H Gaynelle Adu, MD   650 mg at 03/21/18 1729  . Ampicillin-Sulbactam (UNASYN) 3 g in sodium chloride 0.9 %  100 mL IVPB  3 g Intravenous Q6H Gaynelle AduWilson, Eric, MD 200 mL/hr at 03/21/18 1735 3 g at 03/21/18 1735  . bacitracin ointment   Topical BID Gaynelle AduWilson, Eric, MD      . Chlorhexidine Gluconate Cloth 2 % PADS 6 each  6 each Topical Q0600 Gaynelle AduWilson, Eric, MD   6 each at 03/21/18 1425  . [START ON 03/23/2018] enoxaparin (LOVENOX) injection 40 mg  40 mg Subcutaneous Q24H Ralene Muskraturpin, Pamela, PA-C      . HYDROmorphone (DILAUDID) injection 1 mg  1 mg Intravenous Q3H PRN Gaynelle AduWilson, Eric, MD   1 mg at 03/21/18 1509  . Influenza vac split quadrivalent PF (FLUARIX) injection 0.5 mL  0.5 mL Intramuscular Tomorrow-1000 Gaynelle AduWilson, Eric, MD      . insulin aspart (novoLOG) injection 0-15 Units  0-15 Units Subcutaneous Q8H Mancheril, Candis SchatzBenjamin G, RPH      . menthol-cetylpyridinium (CEPACOL) lozenge 3 mg  1 lozenge Oral PRN Gaynelle AduWilson, Eric, MD      . methocarbamol (ROBAXIN) tablet 1,000 mg  1,000 mg Oral TID Focht,  Jessica L, PA   1,000 mg at 03/21/18 1730  . metoprolol tartrate (LOPRESSOR) injection 5 mg  5 mg Intravenous Q3H PRN Gaynelle AduWilson, Eric, MD   5 mg at 03/19/18 1358  . metoprolol tartrate (LOPRESSOR) tablet 25 mg  25 mg Oral BID Violeta Gelinashompson, Burke, MD   25 mg at 03/21/18 0948  . oxyCODONE (Oxy IR/ROXICODONE) immediate release tablet 10-15 mg  10-15 mg Oral Q4H PRN Focht, Jessica L, PA   15 mg at 03/21/18 1729  . promethazine (PHENERGAN) injection 12.5 mg  12.5 mg Intravenous Q4H PRN Gaynelle AduWilson, Eric, MD      . simethicone [facility](MYLICON) chewable tablet 80 mg  80 mg Oral QID PRN Gaynelle AduWilson, Eric, MD   80 mg at 03/20/18 1704  . TPN ADULT (ION)   Intravenous Continuous TPN Sampson SiMancheril, Benjamin G, RPH 95 mL/hr at 03/21/18 1743    . traMADol (ULTRAM) tablet 50 mg  50 mg Oral Q6H Focht, Jessica L, PA   50 mg at 03/21/18 1730     Objective: Vital signs in last 24 hours: Temp:  [98 F (36.7 C)-99 F (37.2 C)] 98 F (36.7 C) (11/20 1624) Pulse Rate:  [64-128] 121 (11/20 1740) Resp:  [13-24] 20 (11/20 1740) BP: (121-143)/(49-83) 143/49 (11/20 1624) SpO2:  [96 %-100 %] 100 % (11/20 1740)  Intake/Output from previous day: 11/19 0701 - 11/20 0700 In: 2262.7 [P.O.:410; I.V.:1047.7; IV Piggyback:800] Out: 2800 [Urine:2025; Drains:50; Stool:725] Intake/Output this shift: Total I/O In: 10 [Other:10] Out: 1120 [Urine:750; Drains:20; Stool:350]   Physical Exam  Lab Results:  Recent Labs    03/20/18 0446  WBC 21.5*  HGB 8.5*  HCT 27.0*  PLT 568*   BMET Recent Labs    03/20/18 0446 03/21/18 0445  NA 136 133*  K 4.0 3.9  CL 100 98  CO2 25 23  GLUCOSE 122* 114*  BUN 19 18  CREATININE 0.65 0.68  CALCIUM 8.7* 8.4*   PT/INR No results for input(s): LABPROT, INR in the last 72 hours. ABG No results for input(s): PHART, HCO3 in the last 72 hours.  Invalid input(s): PCO2, PO2  Studies/Results: Ct Abdomen Pelvis W Contrast  Result Date: 03/21/2018 CLINICAL DATA:  Status post drainage of pelvic  abscess. EXAM: CT ABDOMEN AND PELVIS WITH CONTRAST TECHNIQUE: Multidetector CT imaging of the abdomen and pelvis was performed using the standard protocol following bolus administration of intravenous contrast. CONTRAST:  100mL OMNIPAQUE IOHEXOL 300 MG/ML  SOLN COMPARISON:  CT scan of March 12, 2018. FINDINGS: Lower chest: Stable left posterior basilar subsegmental atelectasis is noted. Hepatobiliary: No focal liver abnormality is seen. No gallstones, gallbladder wall thickening, or biliary dilatation. Pancreas: Unremarkable. No pancreatic ductal dilatation or surrounding inflammatory changes. Spleen: Normal in size without focal abnormality. Adrenals/Urinary Tract: Adrenal glands appear normal. Left kidney and ureter are unremarkable. There is been interval placement of right ureteral stent which extends from right renal pelvis to urinary bladder. Urinary bladder is decompressed secondary to Foley catheter. Stomach/Bowel: The stomach appears normal. There is no evidence of bowel obstruction or inflammation. Left transgluteal pelvic drainage catheter is unchanged in position. Fluid collection is slightly smaller in size, currently measuring 5.3 x 2.3 cm. Pigtail catheter noted in right side of abdomen on prior exam is no longer present. There is been interval placement of a surgical drainage catheter entering the right lower quadrant which extends superiorly along the right pericolic gutter with tip adjacent to right hepatic lobe laterally. This fluid collection is significantly smaller, currently measuring 4.0 x 2.7 cm. New fluid collection measuring 6.6 x 2.5 cm is noted along the right lateral abdominal wall. Another fluid collection measuring 7.0 x 4.3 cm is noted in the pelvis. Crescent-shaped fluid collection measuring 8.9 x 2.7 cm is noted anteriorly in the pelvis. Continued presence of ostomy seen in right lower quadrant. Vascular/Lymphatic: No significant vascular findings are present. No enlarged  abdominal or pelvic lymph nodes. Reproductive: Prostate is unremarkable. Other: Open surgical wound is noted anteriorly in the midline of the abdomen. Musculoskeletal: No acute or significant osseous findings. IMPRESSION: Stable position of left transgluteal pelvic drainage catheter with fluid collection slightly smaller compared to prior exam. However, pigtail drainage catheter noted in right side of abdomen on prior exam has been removed and replaced with large surgical drain entering right lower quadrant. This fluid collection is also significantly smaller compared to prior exam. However, there is been interval development of multiple new fluid collections, including 6.6 x 2.5 cm fluid collection along right lateral abdominal wall, as well as 7.0 x 4.3 cm fluid collection in the pelvis. Crescent-shaped fluid collection measuring 8.9 x 2.7 cm is noted anteriorly in the pelvis. Electronically Signed   By: Lupita Raider, M.D.   On: 03/21/2018 07:20     Assessment and Plan: S/P right ureteral reimplant with psoas hitch and boari flap.  There is increased fluid collecting  in the pelvis around the bladder and repair of uncertain etiology.  His Cr is normal and he has adequate urine output with a decompressed kidney and bladder with the stent and foley in good position.  When he undergoes drain placement tomorrow, he should have the fluid sent for a creatinine level.         LOS: 21 days    Richard [name] 03/21/2018 [phone] ID: Richard E Stthomas Jr., male   DOB: 1996/04/07, 22 y.o.   MRN: [id number]
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
## 2018-03-21 NOTE — Progress Notes (Signed)
OT Cancellation Note  Patient Details Name: Richard E Baby Jr. MRN: [id number] DOB: [birth year]   Cancelled Treatment:    Reason Eval/Treat Not Completed: Other (comment); pt reports increased pain this AM (pt recently pre-medicated) and awaiting to hear back regarding possibly having procedure today. Attempted to educate pt re: moving now while having pain medication on board though pt declining. Agreeable to therapist checking back later if able. Will follow up as schedule permits.  Marcy SirenBreanna Sadiya Durand, OT Supplemental Rehabilitation Services Pager [phone] Office [phone]   Richard PennerBreanna L Tahlor Berenguer 03/21/2018, 10:31 AM

## 2018-03-22 ENCOUNTER — Inpatient Hospital Stay (HOSPITAL_COMMUNITY): Payer: BLUE CROSS/BLUE SHIELD

## 2018-03-22 LAB — COMPREHENSIVE METABOLIC PANEL
ALT: 63 U/L — ABNORMAL HIGH (ref 0–44)
AST: 36 U/L (ref 15–41)
Albumin: 1.8 g/dL — ABNORMAL LOW (ref 3.5–5.0)
Alkaline Phosphatase: 362 U/L — ABNORMAL HIGH (ref 38–126)
Anion gap: 9 (ref 5–15)
BUN: 16 mg/dL (ref 6–20)
CHLORIDE: 100 mmol/L (ref 98–111)
CO2: 25 mmol/L (ref 22–32)
Calcium: 8.2 mg/dL — ABNORMAL LOW (ref 8.9–10.3)
Creatinine, Ser: 0.63 mg/dL (ref 0.61–1.24)
Glucose, Bld: 264 mg/dL — ABNORMAL HIGH (ref 70–99)
POTASSIUM: 4.2 mmol/L (ref 3.5–5.1)
Sodium: 134 mmol/L — ABNORMAL LOW (ref 135–145)
Total Bilirubin: 1.7 mg/dL — ABNORMAL HIGH (ref 0.3–1.2)
Total Protein: 7 g/dL (ref 6.5–8.1)

## 2018-03-22 LAB — GLUCOSE, CAPILLARY
GLUCOSE-CAPILLARY: 121 mg/dL — AB (ref 70–99)
GLUCOSE-CAPILLARY: 126 mg/dL — AB (ref 70–99)
GLUCOSE-CAPILLARY: 143 mg/dL — AB (ref 70–99)
Glucose-Capillary: 96 mg/dL (ref 70–99)

## 2018-03-22 LAB — CBC
HCT: 25.5 % — ABNORMAL LOW (ref 39.0–52.0)
Hemoglobin: 7.6 g/dL — ABNORMAL LOW (ref 13.0–17.0)
MCH: 26.4 pg (ref 26.0–34.0)
MCHC: 29.8 g/dL — ABNORMAL LOW (ref 30.0–36.0)
MCV: 88.5 fL (ref 80.0–100.0)
NRBC: 0 % (ref 0.0–0.2)
PLATELETS: 572 10*3/uL — AB (ref 150–400)
RBC: 2.88 MIL/uL — AB (ref 4.22–5.81)
RDW: 13.6 % (ref 11.5–15.5)
WBC: 16.1 10*3/uL — ABNORMAL HIGH (ref 4.0–10.5)

## 2018-03-22 LAB — PHOSPHORUS: PHOSPHORUS: 5.4 mg/dL — AB (ref 2.5–4.6)

## 2018-03-22 LAB — MAGNESIUM: Magnesium: 1.9 mg/dL (ref 1.7–2.4)

## 2018-03-22 LAB — CREATININE, FLUID (PLEURAL, PERITONEAL, JP DRAINAGE): Creat, Fluid: 1.4 mg/dL

## 2018-03-22 MED ORDER — FENTANYL CITRATE (PF) 100 MCG/2ML IJ SOLN
INTRAMUSCULAR | Status: AC | PRN
  Administered 2018-03-22: 50 ug via INTRAVENOUS
  Administered 2018-03-22 (×2): 25 ug via INTRAVENOUS
  Administered 2018-03-22: 50 ug via INTRAVENOUS

## 2018-03-22 MED ORDER — HYDROMORPHONE HCL 1 MG/ML IJ SOLN
1.0000 mg | Freq: Once | INTRAMUSCULAR | Status: AC
  Administered 2018-03-22: 1 mg via INTRAVENOUS

## 2018-03-22 MED ORDER — MIDAZOLAM HCL 2 MG/2ML IJ SOLN
INTRAMUSCULAR | Status: AC
  Filled 2018-03-22: qty 2

## 2018-03-22 MED ORDER — FENTANYL CITRATE (PF) 100 MCG/2ML IJ SOLN
INTRAMUSCULAR | Status: AC
  Filled 2018-03-22: qty 2

## 2018-03-22 MED ORDER — HYDROMORPHONE HCL 1 MG/ML IJ SOLN
INTRAMUSCULAR | Status: AC
  Filled 2018-03-22: qty 1

## 2018-03-22 MED ORDER — MIDAZOLAM HCL 2 MG/2ML IJ SOLN
INTRAMUSCULAR | Status: AC | PRN
  Administered 2018-03-22: 0.5 mg via INTRAVENOUS
  Administered 2018-03-22 (×2): 1 mg via INTRAVENOUS
  Administered 2018-03-22: 0.5 mg via INTRAVENOUS

## 2018-03-22 MED ORDER — TRAVASOL 10 % IV SOLN: INTRAVENOUS | Status: AC

## 2018-03-22 MED ORDER — SODIUM CHLORIDE 0.9% FLUSH
5.0000 mL | Freq: Three times a day (TID) | INTRAVENOUS | Status: DC
  Administered 2018-03-22 – 2018-03-31 (×22): 5 mL

## 2018-03-22 NOTE — Procedures (Signed)
CT guided drainage of abscess in central lower abdomen.  Placed 10 Fr drain and removed 60 ml of brown thick foul-smelling fluid.  Small collections in right lateral abdomen may be smaller and difficult to differentiate from adjacent bowel on today's CT.  Minimal blood loss and no immediate complication.

## 2018-03-22 NOTE — Progress Notes (Signed)
PT Cancellation Note  Patient Details Name: Richard E Romack Jr. MRN: [id number] DOB: 1996-02-07   Cancelled Treatment:    Reason Eval/Treat Not Completed: Medical issues which prohibited therapy(pt with increased abdominal fluid collection to go to IR today. Will hold at this time)   Jo-Anne Kluth B Feven Alderfer 03/22/2018, 7:59 AM  Delaney MeigsMaija Tabor Raymonde Hamblin, PT Acute Rehabilitation Services Pager: [phone] Office: [phone]

## 2018-03-22 NOTE — Progress Notes (Signed)
OT Cancellation Note  Patient Details Name: Richard E Ransford Jr. MRN: [id number] DOB: 1995/07/31   Cancelled Treatment:    Reason Eval/Treat Not Completed: Patient at procedure or test/ unavailable  [facility] AssociationWARD,HILLARY  Luisa DagoHilary Rondell Frick, OT/L   Acute OT Clinical Specialist Acute Rehabilitation Services Pager [phone] Office [phone]  03/22/2018, 10:36 AM
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
## 2018-03-23 ENCOUNTER — Inpatient Hospital Stay (HOSPITAL_COMMUNITY): Payer: BLUE CROSS/BLUE SHIELD

## 2018-03-23 LAB — BASIC METABOLIC PANEL
Anion gap: 9 (ref 5–15)
BUN: 20 mg/dL (ref 6–20)
CALCIUM: 9 mg/dL (ref 8.9–10.3)
CO2: 25 mmol/L (ref 22–32)
CREATININE: 0.68 mg/dL (ref 0.61–1.24)
Chloride: 102 mmol/L (ref 98–111)
GFR calc Af Amer: >60 mL/min (ref >60)
Glucose, Bld: 96 mg/dL (ref 70–99)
Potassium: 4.3 mmol/L (ref 3.5–5.1)
Sodium: 136 mmol/L (ref 135–145)

## 2018-03-23 LAB — CBC
HCT: 26 % — ABNORMAL LOW (ref 39.0–52.0)
Hemoglobin: 7.9 g/dL — ABNORMAL LOW (ref 13.0–17.0)
MCH: 26.9 pg (ref 26.0–34.0)
MCHC: 30.4 g/dL (ref 30.0–36.0)
MCV: 88.4 fL (ref 80.0–100.0)
NRBC: 0 % (ref 0.0–0.2)
PLATELETS: 548 10*3/uL — AB (ref 150–400)
RBC: 2.94 MIL/uL — AB (ref 4.22–5.81)
RDW: 13.9 % (ref 11.5–15.5)
WBC: 11.4 10*3/uL — AB (ref 4.0–10.5)

## 2018-03-23 LAB — GLUCOSE, CAPILLARY
GLUCOSE-CAPILLARY: 100 mg/dL — AB (ref 70–99)
Glucose-Capillary: 77 mg/dL (ref 70–99)
Glucose-Capillary: 116 mg/dL — ABNORMAL HIGH (ref 70–99)

## 2018-03-23 NOTE — Progress Notes (Signed)
A 4 by 4 fell out from loose tape associated with the wound dressing.   Nurse repacked dressing (wet to dry) and covered with abd and reinforced with more tape. Patient was given dilaudid prior to dressing change.
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
## 2018-03-23 NOTE — Progress Notes (Signed)
Nurse was bedside with Dr. Lindie SpruceWyatt and Tresa EndoKelly, P.A with dressing change this morning. Patient had some pus and Physician and Physician Assistant used a catheter to irrigate the wound from top to bottom. A 1/4 inch piece of packing was used for the top of the wound and wet to dry for the bottom portion of the wound.   Dr. Lindie SpruceWyatt would like for Nursing to irrigate wound with the Q shift wound change but be careful to use 4 X 4 and not a 2 X 2 to reduce/eliminate the risk of foreign body.

## 2018-03-23 NOTE — Progress Notes (Signed)
Ortho Trauma Progress Note:  Leg feels good, feels like he is ready to walk on it  X-rays stable  Plan to advance to WBAT RLE Return to see me in 4 weeks Continue PT/OT  Roby LoftsKevin P. Seraiah Nowack, MD [facility] [phone] (phone)

## 2018-03-24 LAB — GLUCOSE, CAPILLARY
Glucose-Capillary: 85 mg/dL (ref 70–99)
Glucose-Capillary: 119 mg/dL — ABNORMAL HIGH (ref 70–99)

## 2018-03-24 LAB — AEROBIC/ANAEROBIC CULTURE (SURGICAL/DEEP WOUND): SPECIAL REQUESTS: NORMAL

## 2018-03-24 LAB — AEROBIC/ANAEROBIC CULTURE W GRAM STAIN (SURGICAL/DEEP WOUND)
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
## 2018-03-25 LAB — BASIC METABOLIC PANEL
ANION GAP: 8 (ref 5–15)
BUN: 13 mg/dL (ref 6–20)
CO2: 26 mmol/L (ref 22–32)
Calcium: 9 mg/dL (ref 8.9–10.3)
Chloride: 102 mmol/L (ref 98–111)
Creatinine, Ser: 0.65 mg/dL (ref 0.61–1.24)
Glucose, Bld: 100 mg/dL — ABNORMAL HIGH (ref 70–99)
Potassium: 4.1 mmol/L (ref 3.5–5.1)
SODIUM: 136 mmol/L (ref 135–145)

## 2018-03-25 LAB — CBC
HEMATOCRIT: 30.7 % — AB (ref 39.0–52.0)
Hemoglobin: 9.3 g/dL — ABNORMAL LOW (ref 13.0–17.0)
MCH: 26.3 pg (ref 26.0–34.0)
MCHC: 30.3 g/dL (ref 30.0–36.0)
MCV: 87 fL (ref 80.0–100.0)
NRBC: 0 % (ref 0.0–0.2)
PLATELETS: 583 10*3/uL — AB (ref 150–400)
RBC: 3.53 MIL/uL — ABNORMAL LOW (ref 4.22–5.81)
RDW: 13.7 % (ref 11.5–15.5)
WBC: 8.4 10*3/uL (ref 4.0–10.5)

## 2018-03-25 LAB — GLUCOSE, CAPILLARY
GLUCOSE-CAPILLARY: 118 mg/dL — AB (ref 70–99)
GLUCOSE-CAPILLARY: 119 mg/dL — AB (ref 70–99)
GLUCOSE-CAPILLARY: 95 mg/dL (ref 70–99)
Glucose-Capillary: 124 mg/dL — ABNORMAL HIGH (ref 70–99)
Glucose-Capillary: 97 mg/dL (ref 70–99)
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
## 2018-03-26 ENCOUNTER — Other Ambulatory Visit: Payer: Self-pay | Admitting: Physician Assistant

## 2018-03-26 ENCOUNTER — Inpatient Hospital Stay (HOSPITAL_COMMUNITY): Payer: BLUE CROSS/BLUE SHIELD

## 2018-03-26 DIAGNOSIS — S36508D Unspecified injury of other part of colon, subsequent encounter: Secondary | ICD-10-CM

## 2018-03-26 LAB — GLUCOSE, CAPILLARY: GLUCOSE-CAPILLARY: 114 mg/dL — AB (ref 70–99)

## 2018-03-26 MED ORDER — IOTHALAMATE MEGLUMINE 17.2 % UR SOLN
250.0000 mL | Freq: Once | URETHRAL | Status: AC | PRN
  Administered 2018-03-26: 125 mL via INTRAVESICAL

## 2018-03-26 MED ORDER — HYDROMORPHONE HCL 1 MG/ML IJ SOLN
1.0000 mg | Freq: Once | INTRAMUSCULAR | Status: AC
  Administered 2018-03-26: 1 mg via INTRAVENOUS
  Filled 2018-03-26: qty 1
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
## 2018-03-26 NOTE — Progress Notes (Signed)
OT Cancellation Note  Patient Details Name: Richard E Bolt Jr. MRN: [id number] DOB: June 27, 1995   Cancelled Treatment:    Reason Eval/Treat Not Completed: Patient at procedure or test/ unavailable(With PT. Will return as schedule allows. )  [name] [name] [name] MSOT, OTR/L Acute Rehab Pager: [phone] Office: [phone] 03/26/2018, 9:13 AM

## 2018-03-26 NOTE — Progress Notes (Signed)
Patient ID: Richard E Yohe Jr., male   DOB: Sep 12, 1995, 22 y.o.   MRN: [id number]  I have reviewed the cystogram films and report.   He has a small bladder as expected after creation of the boari flap.   The ureter is reported as dilated but the dilated segment is actually the proximal portion of the boari flap.  There is a narrowed area in the more distal area where it enters the bladder proper.  The ureter proximal to the flap is not dilated.  There is no urine leak and the stent is in good position.     I will place the order for foley removal to be done in the AM.
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
## 2018-03-26 NOTE — Progress Notes (Signed)
OT Cancellation Note  Patient Details Name: Richard E [place]emsburg Jr. MRN: [id number] DOB: 31-Mar-1996   Cancelled Treatment:    Reason Eval/Treat Not Completed: Patient at procedure or test/ unavailable(off the floor at radiology. Will return as schedule allows.)  Richard [name] M Kajal Scalici Dashanna Kinnamon MSOT, OTR/L Acute Rehab Pager: [phone] Office: [phone] 03/26/2018, 2:47 PM

## 2018-03-26 NOTE — Consult Note (Signed)
WOC Nurse ostomy follow up Stoma type/location: RLQ, end ileostomy  Stomal assessment/size: 1 3/4" flush, pink, moist Peristomal assessment: intact Treatment options for stomal/peristomal skin: using 2" barrier ring to aid in the seal of pouch with flush stoma Output liquid brown Ostomy pouching: 1pc flexible convex with 2" barrier ring. [place] Yes  Today extended discussion with patient on the need for him to assume some self care. Goal would be to have him empty his own pouch. I have explained the rationale for this. He is reluctant but did look at his stoma today.  His girlfriend is at the bedside and has assumed most of the care for his ostomy.  I shaved the patient's peristomal skin today and explained need to do this at home and to shave with the grain of the hair to avoid folliculitis.  He report less pain with pouch removal today. Seems in better spirits.   WOC nurse team will follow along with you for support with ostomy care and teaching. Goal would be to have bedside nurses work with patient for self emptying.  Teddy Rebstock [facility] MSN,RN,CWOCN, CNS, [employer] [phone]

## 2018-03-26 NOTE — Progress Notes (Signed)
Patient ID: Richard E Mackie Jr., male   DOB: 1996-03-24, 22 y.o.   MRN: [id number]   14 Days Post-Op  Subjective: Richard is now 2 weeks out from repair of the ureteral injury.  His wound drainage is minimal.   He is tolerating the foley well.  ROS:  ROS  Anti-infectives: Anti-infectives (From admission, onward)   Start     Dose/Rate Route Frequency Ordered Stop   03/13/18 1600  Ampicillin-Sulbactam (UNASYN) 3 g in sodium chloride 0.9 % 100 mL IVPB     3 g 200 mL/hr over 30 Minutes Intravenous Every 6 hours 03/13/18 1538     03/07/18 1600  piperacillin-tazobactam (ZOSYN) IVPB 3.375 g  Status:  Discontinued     3.375 g 12.5 mL/hr over 240 Minutes Intravenous Every 8 hours 03/07/18 0910 03/13/18 1537   03/07/18 0930  piperacillin-tazobactam (ZOSYN) IVPB 3.375 g     3.375 g 100 mL/hr over 30 Minutes Intravenous  Once 03/07/18 0910 03/07/18 1104   03/01/18 1800  ceFAZolin (ANCEF) IVPB 2g/100 mL premix  Status:  Discontinued     2 g 200 mL/hr over 30 Minutes Intravenous Every 8 hours 03/01/18 1249 03/01/18 1405   03/01/18 1115  vancomycin (VANCOCIN) powder  Status:  Discontinued       As needed 03/01/18 1115 03/01/18 1238   03/01/18 0938  ceFAZolin (ANCEF) 2-4 GM/100ML-% IVPB    Note to Pharmacy:  Aquilla Hacker   : cabinet override      03/01/18 0938 03/01/18 2144   03/01/18 0600  ceFAZolin (ANCEF) IVPB 2g/100 mL premix  Status:  Discontinued     2 g 200 mL/hr over 30 Minutes Intravenous On call to O.R. 02/28/18 1815 02/28/18 1820   02/28/18 2100  cefoTEtan (CEFOTAN) 2 g in sodium chloride 0.9 % 100 mL IVPB  Status:  Discontinued     2 g 200 mL/hr over 30 Minutes Intravenous Every 12 hours 02/28/18 1339 02/28/18 1921   02/28/18 2100  cefoTEtan (CEFOTAN) 2 g in sodium chloride 0.9 % 100 mL IVPB  Status:  Discontinued     2 g 200 mL/hr over 30 Minutes Intravenous Every 12 hours 02/28/18 1916 02/28/18 1916   02/28/18 2100  cefoTEtan in Dextrose 5% (CEFOTAN) IVPB 2 g     2 g 100 mL/hr  over 30 Minutes Intravenous Every 12 hours 02/28/18 1921 03/03/18 2159   02/28/18 0915  cefoTEtan in Dextrose 5% (CEFOTAN) IVPB 2 g  Status:  Discontinued     2 g 100 mL/hr over 30 Minutes Intravenous To Surgery 02/28/18 0907 02/28/18 1339   02/28/18 0900  cefoTEtan (CEFOTAN) 2 g in sodium chloride 0.9 % 100 mL IVPB     2 g 200 mL/hr over 30 Minutes Intravenous  Once 02/28/18 0816 02/28/18 8657      Current Facility-Administered Medications  Medication Dose Route Frequency Provider Last Rate Last Dose  . 0.9 %  sodium chloride infusion   Intravenous PRN Gaynelle Adu, MD 10 mL/hr at 03/10/18 1628 500 mL at 03/10/18 1628  . acetaminophen (TYLENOL) tablet 650 mg  650 mg Oral Q6H Gaynelle Adu, MD   650 mg at 03/26/18 8469  . Ampicillin-Sulbactam (UNASYN) 3 g in sodium chloride 0.9 % 100 mL IVPB  3 g Intravenous Q6H Gaynelle Adu, MD 200 mL/hr at 03/26/18 0502 3 g at 03/26/18 0502  . Chlorhexidine Gluconate Cloth 2 % PADS 6 each  6 each Topical Q0600 Gaynelle Adu, MD   6 each at 03/24/18 1350  .  enoxaparin (LOVENOX) injection 40 mg  40 mg Subcutaneous Q24H Ralene Muskraturpin, Pamela, PA-C   40 mg at 03/25/18 2322  . HYDROmorphone (DILAUDID) injection 1 mg  1 mg Intravenous Q3H PRN Gaynelle AduWilson, Eric, MD   1 mg at 03/26/18 0457  . Influenza vac split quadrivalent PF (FLUARIX) injection 0.5 mL  0.5 mL Intramuscular Tomorrow-1000 Gaynelle AduWilson, Eric, MD      . insulin aspart (novoLOG) injection 0-15 Units  0-15 Units Subcutaneous Q8H Sampson SiMancheril, Benjamin G, RPH   2 Units at 03/22/18 2351  . menthol-cetylpyridinium (CEPACOL) lozenge 3 mg  1 lozenge Oral PRN Gaynelle AduWilson, Eric, MD      . methocarbamol (ROBAXIN) tablet 1,000 mg  1,000 mg Oral TID Mattie MarlinFocht, Jessica L, PA   1,000 mg at 03/25/18 2115  . metoprolol tartrate (LOPRESSOR) injection 5 mg  5 mg Intravenous Q3H PRN Gaynelle AduWilson, Eric, MD   5 mg at 03/19/18 1358  . metoprolol tartrate (LOPRESSOR) tablet 25 mg  25 mg Oral BID Violeta Gelinashompson, Burke, MD   25 mg at 03/25/18 2115  . oxyCODONE (Oxy  IR/ROXICODONE) immediate release tablet 10-15 mg  10-15 mg Oral Q4H PRN Focht, Jessica L, PA   15 mg at 03/25/18 2320  . promethazine (PHENERGAN) injection 12.5 mg  12.5 mg Intravenous Q4H PRN Gaynelle AduWilson, Eric, MD      . simethicone [facility](MYLICON) chewable tablet 80 mg  80 mg Oral QID PRN Gaynelle AduWilson, Eric, MD   80 mg at 03/24/18 1944  . sodium chloride flush (NS) 0.9 % injection 5 mL  5 mL Intracatheter Q8H Richarda OverlieHenn, Adam, MD   5 mL at 03/25/18 2115  . traMADol (ULTRAM) tablet 50 mg  50 mg Oral Q6H Focht, Jessica L, PA   50 mg at 03/26/18 [id number]     Objective: Vital signs in last 24 hours: Temp:  [97.7 F (36.5 C)-98.3 F (36.8 C)] 97.7 F (36.5 C) (11/25 0400) Pulse Rate:  [89-114] 89 (11/25 0400) Resp:  [13-19] 13 (11/25 0400) BP: (106-123)/(65-73) 106/65 (11/25 0400) SpO2:  [97 %-100 %] 97 % (11/25 0400)  Intake/Output from previous day: 11/24 0701 - 11/25 0700 In: 415 [IV Piggyback:400] Out: 2600 [Urine:1050; Drains:45; Stool:1505] Intake/Output this shift: No intake/output data recorded.   Physical Exam  Lab Results:  Recent Labs    03/25/18 0500  WBC 8.4  HGB 9.3*  HCT 30.7*  PLT 583*   BMET Recent Labs    03/25/18 0500  NA 136  K 4.1  CL 102  CO2 26  GLUCOSE 100*  BUN 13  CREATININE 0.65  CALCIUM 9.0   PT/INR No results for input(s): LABPROT, INR in the last 72 hours. ABG No results for input(s): PHART, HCO3 in the last 72 hours.  Invalid input(s): PCO2, PO2  Studies/Results: No results found.   Assessment and Plan: 2wks out from reimplant with boari flap.  I will order a cystogram and consider foley removal.       LOS: 26 days    Richard PippinJohn Navaya [name] 03/26/2018 [phone]
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
## 2018-03-26 NOTE — Progress Notes (Signed)
Flushed right lateral abdomen drain and small amount of saline drained from insertion site; purulent drainage still drainage still draining from bulb.

## 2018-03-27 ENCOUNTER — Inpatient Hospital Stay (HOSPITAL_COMMUNITY): Payer: BLUE CROSS/BLUE SHIELD

## 2018-03-27 MED ORDER — IOHEXOL 300 MG/ML  SOLN
100.0000 mL | Freq: Once | INTRAMUSCULAR | Status: AC | PRN
  Administered 2018-03-27: 100 mL via INTRAVENOUS
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
## 2018-03-27 NOTE — Progress Notes (Signed)
OT Cancellation Note  Patient Details Name: Richard E Kulaga Jr. MRN: [id number] DOB: 18-May-1995   Cancelled Treatment:    Reason Eval/Treat Not Completed: Other (comment); pt declining at this time, reports he worked with physical therapy earlier today. Will follow up for OT tx as schedule permits.  Marcy SirenBreanna Lurlean Kernen, OT [facility] Pager [phone] Office [phone]  Richard [name] 03/27/2018, 4:27 PM
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
## 2018-03-28 NOTE — Progress Notes (Signed)
PT refused lovenox until he can have dilaudid. It was explained that dilaudid is for breakthrough pain and that he would not be able to go home with this medication. Pt stated that this is the only thing that helps him get to sleep. HE stated that the other pain meds dont help. Oxy 15 mg was given at that time and no further complaints have been made. Will continue to monitor. Mayford KnifeAmber Jimia Gentles RN
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
## 2018-03-28 NOTE — Progress Notes (Signed)
Probably will have transgluteal drain removed and new left pelvic drain placed.  Will talk with Dr. Annabell HowellsWrenn about need to keep the surgical drian.  This patient has been seen and I agree with the findings and treatment plan.  Marta LamasJames O. Gae BonWyatt, III, MD, FACS [phone] (pager) [phone] (direct pager) Trauma Surgeon
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
## 2018-03-28 NOTE — Plan of Care (Signed)
  Problem: Education: Goal: Knowledge of General Education information will improve Description: Including pain rating scale, medication(s)/side effects and non-pharmacologic comfort measures Outcome: Progressing   Problem: Health Behavior/Discharge Planning: Goal: Ability to manage health-related needs will improve Outcome: Progressing   Problem: Clinical Measurements: Goal: Ability to maintain clinical measurements within normal limits will improve Outcome: Progressing Goal: Will remain free from infection Outcome: Progressing Goal: Diagnostic test results will improve Outcome: Progressing Goal: Respiratory complications will improve Outcome: Progressing Goal: Cardiovascular complication will be avoided Outcome: Progressing   Problem: Activity: Goal: Risk for activity intolerance will decrease Outcome: Progressing   Problem: Nutrition: Goal: Adequate nutrition will be maintained Outcome: Progressing   Problem: Coping: Goal: Level of anxiety will decrease Outcome: Progressing   Problem: Elimination: Goal: Will not experience complications related to bowel motility Outcome: Progressing Goal: Will not experience complications related to urinary retention Outcome: Progressing   Problem: Pain Managment: Goal: General experience of comfort will improve Outcome: Progressing   Problem: Safety: Goal: Ability to remain free from injury will improve Outcome: Progressing   Problem: Skin Integrity: Goal: Risk for impaired skin integrity will decrease Outcome: Progressing   Problem: Education: Goal: Verbalization of understanding the information provided (i.e., activity precautions, restrictions, etc) will improve Outcome: Progressing   Problem: Activity: Goal: Ability to ambulate and perform ADLs will improve Outcome: Progressing   Problem: Clinical Measurements: Goal: Postoperative complications will be avoided or minimized Outcome: Progressing   Problem:  Self-Concept: Goal: Ability to maintain and perform role responsibilities to the fullest extent possible will improve Outcome: Progressing   Problem: Pain Management: Goal: Pain level will decrease Outcome: Progressing   
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
## 2018-03-28 NOTE — Consult Note (Signed)
WOC Nurse ostomy follow up Stoma type/location: RLQ, end ileostomy Called by OT, pouch leaking unclear where from. Noted that pouch was leaking on Tuesday and changed by bedside nursing staff. Patient is not able to tell me which direction if any it routinely leaks from. Today it is not leaking from the wafer.  It appears the seam of the bag has malfunctioned or possibly a hole was cut into the pouch.  Stomal assessment/size:  1 3/4" round, flush with the skin  Peristomal assessment: intact  Treatment options for stomal/peristomal skin: 2" barrier ring Output liquid green  Ostomy pouching: 1pc.flexible, 2 inch barrier ring   Feel patient might need ostomy belt, however our system only carries large belts and I can not make it small enough to fit the patient.  Will attempt to find samples of smaller belt.   [place] in [place] [employer] [place] Dis[place] program: Yes  WOC Nurse will follow along with you for continued support with ostomy teaching and care Deola Rewis [facility] MSN, RN, [place], CNS, [place] [phone]
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
## 2018-03-29 ENCOUNTER — Inpatient Hospital Stay (HOSPITAL_COMMUNITY): Payer: BLUE CROSS/BLUE SHIELD

## 2018-03-29 LAB — CBC
HCT: 27.1 % — ABNORMAL LOW (ref 39.0–52.0)
Hemoglobin: 8 g/dL — ABNORMAL LOW (ref 13.0–17.0)
MCH: 26.3 pg (ref 26.0–34.0)
MCHC: 29.5 g/dL — ABNORMAL LOW (ref 30.0–36.0)
MCV: 89.1 fL (ref 80.0–100.0)
NRBC: 0 % (ref 0.0–0.2)
PLATELETS: 530 10*3/uL — AB (ref 150–400)
RBC: 3.04 MIL/uL — AB (ref 4.22–5.81)
RDW: 14.6 % (ref 11.5–15.5)
WBC: 8 10*3/uL (ref 4.0–10.5)

## 2018-03-29 LAB — PROTIME-INR
INR: 1.29
Prothrombin Time: 15.9 seconds — ABNORMAL HIGH (ref 11.4–15.2)

## 2018-03-29 MED ORDER — MIDAZOLAM HCL 2 MG/2ML IJ SOLN
INTRAMUSCULAR | Status: AC | PRN
  Administered 2018-03-29 (×4): 1 mg via INTRAVENOUS

## 2018-03-29 MED ORDER — LIDOCAINE HCL 1 % IJ SOLN
INTRAMUSCULAR | Status: AC
  Filled 2018-03-29: qty 20

## 2018-03-29 MED ORDER — MIDAZOLAM HCL 2 MG/2ML IJ SOLN
INTRAMUSCULAR | Status: AC
  Filled 2018-03-29: qty 4

## 2018-03-29 MED ORDER — FENTANYL CITRATE (PF) 100 MCG/2ML IJ SOLN
INTRAMUSCULAR | Status: AC | PRN
  Administered 2018-03-29 (×4): 50 ug via INTRAVENOUS

## 2018-03-29 MED ORDER — SODIUM CHLORIDE 0.9% FLUSH
5.0000 mL | Freq: Three times a day (TID) | INTRAVENOUS | Status: DC
  Administered 2018-03-29 – 2018-03-31 (×5): 5 mL

## 2018-03-29 MED ORDER — FENTANYL CITRATE (PF) 100 MCG/2ML IJ SOLN
INTRAMUSCULAR | Status: AC
  Filled 2018-03-29: qty 4

## 2018-03-29 MED FILL — Fentanyl Citrate Preservative Free (PF) Inj 100 MCG/2ML: INTRAMUSCULAR | Qty: 100 | Status: AC

## 2018-03-29 MED FILL — Midazolam HCl Inj 2 MG/2ML (Base Equivalent): INTRAMUSCULAR | Qty: 1 | Status: AC

## 2018-03-29 MED FILL — Fentanyl Citrate Preservative Free (PF) Inj 100 MCG/2ML: INTRAMUSCULAR | Qty: 50 | Status: AC

## 2018-03-29 NOTE — Procedures (Signed)
Interventional Radiology Procedure Note  Procedure: CT guided drainage of left pelvic abscess; removal of drains x 2   Complications: None  Estimated Blood Loss: None  Findings: Left anterior pelvic abscess yielded turbid, orange/yellow fluid. Sample of 15 mL sent for culture. New 10 Fr drain placed and attached to suction bulb.  Left anterior pelvic pigtail drain and left transgluteal drain removed.  Jodi MarbleGlenn T. Fredia SorrowYamagata, M.D Pager:  [phone]
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
## 2018-03-30 ENCOUNTER — Inpatient Hospital Stay (HOSPITAL_COMMUNITY): Payer: BLUE CROSS/BLUE SHIELD

## 2018-03-30 LAB — CBC WITH DIFFERENTIAL/PLATELET
Abs Immature Granulocytes: 0.04 10*3/uL (ref 0.00–0.07)
BASOS ABS: 0 10*3/uL (ref 0.0–0.1)
Basophils Relative: 0 %
Eosinophils Absolute: 0.1 10*3/uL (ref 0.0–0.5)
Eosinophils Relative: 1 %
HCT: 24.9 % — ABNORMAL LOW (ref 39.0–52.0)
Hemoglobin: 7.4 g/dL — ABNORMAL LOW (ref 13.0–17.0)
Immature Granulocytes: 1 %
Lymphocytes Relative: 15 %
Lymphs Abs: 1.3 10*3/uL (ref 0.7–4.0)
MCH: 26.2 pg (ref 26.0–34.0)
MCHC: 29.7 g/dL — ABNORMAL LOW (ref 30.0–36.0)
MCV: 88.3 fL (ref 80.0–100.0)
Monocytes Absolute: 0.9 10*3/uL (ref 0.1–1.0)
Monocytes Relative: 10 %
NRBC: 0 % (ref 0.0–0.2)
Neutro Abs: 6.3 10*3/uL (ref 1.7–7.7)
Neutrophils Relative %: 73 %
Platelets: 476 10*3/uL — ABNORMAL HIGH (ref 150–400)
RBC: 2.82 MIL/uL — ABNORMAL LOW (ref 4.22–5.81)
RDW: 14.3 % (ref 11.5–15.5)
WBC: 8.7 10*3/uL (ref 4.0–10.5)

## 2018-03-30 LAB — CREATININE, FLUID (PLEURAL, PERITONEAL, JP DRAINAGE)

## 2018-03-30 LAB — BASIC METABOLIC PANEL
Anion gap: 12 (ref 5–15)
BUN: 6 mg/dL (ref 6–20)
CALCIUM: 6.9 mg/dL — AB (ref 8.9–10.3)
CO2: 22 mmol/L (ref 22–32)
Chloride: 112 mmol/L — ABNORMAL HIGH (ref 98–111)
Creatinine, Ser: 1.4 mg/dL — ABNORMAL HIGH (ref 0.61–1.24)
GFR calc Af Amer: >60 mL/min (ref >60)
GFR calc non Af Amer: >60 mL/min (ref >60)
Glucose, Bld: 89 mg/dL (ref 70–99)
Potassium: 3 mmol/L — ABNORMAL LOW (ref 3.5–5.1)
Sodium: 146 mmol/L — ABNORMAL HIGH (ref 135–145)

## 2018-03-30 MED ORDER — SODIUM CHLORIDE 0.9% FLUSH
10.0000 mL | Freq: Two times a day (BID) | INTRAVENOUS | Status: DC
  Administered 2018-03-30 – 2018-03-31 (×2): 10 mL

## 2018-03-30 MED ORDER — ALTEPLASE 2 MG IJ SOLR
2.0000 mg | Freq: Once | INTRAMUSCULAR | Status: AC
  Administered 2018-03-30: 2 mg

## 2018-03-30 MED ORDER — SODIUM CHLORIDE 0.9% FLUSH
10.0000 mL | INTRAVENOUS | Status: DC | PRN
  Administered 2018-03-30: 40 mL
  Filled 2018-03-30: qty 40
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
## 2018-03-30 NOTE — Progress Notes (Signed)
PT Cancellation Note  Patient Details Name: Richard E Blossom Jr. MRN: [id number] DOB: [birth year]   Cancelled Treatment:    Reason Eval/Treat Not Completed: Other (comment)(pt currently with open abdomen awaiting dressing change)   Maelani Yarbro B Cienna Dumais 03/30/2018, 11:22 AM  Delaney MeigsMaija Tabor Mico Spark, PT Acute Rehabilitation Services Pager: [phone] Office: [phone]
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
## 2018-03-30 NOTE — Progress Notes (Signed)
Possible d/c today  Drain fluid Cr yesterday resulted as ">0.30"--I inquired and this is a typo--will be changed to "< 0.30".  We will arrange proper f/u.

## 2018-03-31 LAB — BASIC METABOLIC PANEL
Anion gap: 11 (ref 5–15)
Anion gap: 13 (ref 5–15)
BUN: 20 mg/dL (ref 6–20)
BUN: 6 mg/dL (ref 6–20)
CO2: 26 mmol/L (ref 22–32)
CO2: 27 mmol/L (ref 22–32)
Calcium: 8.9 mg/dL (ref 8.9–10.3)
Calcium: 8.8 mg/dL — ABNORMAL LOW (ref 8.9–10.3)
Chloride: 96 mmol/L — ABNORMAL LOW (ref 98–111)
Chloride: 97 mmol/L — ABNORMAL LOW (ref 98–111)
Creatinine, Ser: 0.8 mg/dL (ref 0.61–1.24)
Creatinine, Ser: 0.81 mg/dL (ref 0.61–1.24)
GFR calc Af Amer: >60 mL/min (ref >60)
GFR calc Af Amer: >60 mL/min (ref >60)
GFR calc non Af Amer: >60 mL/min (ref >60)
GFR calc non Af Amer: >60 mL/min (ref >60)
GLUCOSE: 145 mg/dL — AB (ref 70–99)
Glucose, Bld: 106 mg/dL — ABNORMAL HIGH (ref 70–99)
POTASSIUM: 3.8 mmol/L (ref 3.5–5.1)
Potassium: 4.1 mmol/L (ref 3.5–5.1)
Sodium: 133 mmol/L — ABNORMAL LOW (ref 135–145)
Sodium: 137 mmol/L (ref 135–145)

## 2018-03-31 LAB — CBC
HCT: 36.4 % — ABNORMAL LOW (ref 39.0–52.0)
HCT: 27.6 % — ABNORMAL LOW (ref 39.0–52.0)
HEMOGLOBIN: 8.4 g/dL — AB (ref 13.0–17.0)
Hemoglobin: 11.6 g/dL — ABNORMAL LOW (ref 13.0–17.0)
MCH: 28.6 pg (ref 26.0–34.0)
MCH: 26.8 pg (ref 26.0–34.0)
MCHC: 31.9 g/dL (ref 30.0–36.0)
MCHC: 30.4 g/dL (ref 30.0–36.0)
MCV: 89.7 fL (ref 80.0–100.0)
MCV: 88.2 fL (ref 80.0–100.0)
Platelets: 250 10*3/uL (ref 150–400)
Platelets: 476 10*3/uL — ABNORMAL HIGH (ref 150–400)
RBC: 4.06 MIL/uL — ABNORMAL LOW (ref 4.22–5.81)
RBC: 3.13 MIL/uL — ABNORMAL LOW (ref 4.22–5.81)
RDW: 12.2 % (ref 11.5–15.5)
RDW: 14.6 % (ref 11.5–15.5)
WBC: 7.7 10*3/uL (ref 4.0–10.5)
WBC: 8.9 10*3/uL (ref 4.0–10.5)
nRBC: 0 % (ref 0.0–0.2)
nRBC: 0 % (ref 0.0–0.2)

## 2018-03-31 MED ORDER — SODIUM CHLORIDE 0.9% FLUSH: 5.0000 mL | Freq: Every day | INTRAVENOUS | 0 refills | Status: DC

## 2018-03-31 MED ORDER — OXYCODONE HCL 10 MG PO TABS: 10.0000 mg | ORAL_TABLET | Freq: Four times a day (QID) | ORAL | 0 refills | Status: DC | PRN

## 2018-03-31 MED ORDER — TRAMADOL HCL 50 MG PO TABS: 50.0000 mg | ORAL_TABLET | Freq: Four times a day (QID) | ORAL | 0 refills | Status: DC | PRN

## 2018-03-31 MED ORDER — METOPROLOL TARTRATE 25 MG PO TABS: 25.0000 mg | ORAL_TABLET | Freq: Two times a day (BID) | ORAL | 0 refills | Status: DC

## 2018-03-31 MED ORDER — ACETAMINOPHEN 325 MG PO TABS: 650.0000 mg | ORAL_TABLET | Freq: Four times a day (QID) | ORAL | Status: DC | PRN

## 2018-03-31 NOTE — Progress Notes (Signed)
While emptying left JP drain sutures around white adapter were accidentally removed. The drain is still intact and will hold suction. Secured white adapter to skin with tape.  IR was contacted and message was left on answering service. Hosie Spanglelizabeth Simaan was made aware.
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
## 2018-03-31 NOTE — Progress Notes (Signed)
Patient ID: Richard E Capetillo Jr., male   DOB: [birth year], 22 y.o.   MRN: [id number]   Richard is voiding well with some frequency and urgency but he is able to hold it and has no flank pain.     BP 111/63   Pulse 99   Temp 98.4 F (36.9 C) (Oral)   Resp 18   Ht 5' 10.98" (1.803 m)   Wt 81.7 kg   SpO2 100%   BMI 25.13 kg/m    My office will arrange f/u for stent removal for 2-3 weeks from discharge.

## 2018-03-31 NOTE — Care Management (Signed)
Confirmed with [facility], Nida BoatmanBrad, that start of care will be 12/3.

## 2018-03-31 NOTE — Discharge Instructions (Signed)
Ureteral Stent Implantation, Care After Refer to this sheet in the next few weeks. These instructions provide you with information about caring for yourself after your procedure. Your health care provider may also give you more specific instructions. Your treatment has been planned according to current medical practices, but problems sometimes occur. Call your health care provider if you have any problems or questions after your procedure. What can I expect after the procedure? After the procedure, it is common to have: Nausea. Mild pain when you urinate. You may feel this pain in your lower back or lower abdomen. Pain should stop within a few minutes after you urinate. This may last for up to 1 week. A small amount of blood in your urine for several days.  Follow these instructions at home:  Medicines Take over-the-counter and prescription medicines only as told by your health care provider. If you were prescribed an antibiotic medicine, take it as told by your health care provider. Do not stop taking the antibiotic even if you start to feel better. Do not drive for 24 hours if you received a sedative. Do not drive or operate heavy machinery while taking prescription pain medicines. Activity Return to your normal activities as told by your health care provider. Ask your health care provider what activities are safe for you. Do not lift anything that is heavier than 10 lb (4.5 kg). Follow this limit for 1 week after your procedure, or for as long as told by your health care provider. General instructions Watch for any blood in your urine. Call your health care provider if the amount of blood in your urine increases. If you have a catheter: Follow instructions from your health care provider about taking care of your catheter and collection bag. Do not take baths, swim, or use a hot tub until your health care provider approves. Drink enough fluid to keep your urine clear or pale yellow. Keep  all follow-up visits as told by your health care provider. This is important. Contact a health care provider if: You have pain that gets worse or does not get better with medicine, especially pain when you urinate. You have difficulty urinating. You feel nauseous or you vomit repeatedly during a period of more than 2 days after the procedure. Get help right away if: Your urine is dark red or has blood clots in it. You are leaking urine (have incontinence). The end of the stent comes out of your urethra. You cannot urinate. You have sudden, sharp, or severe pain in your abdomen or lower back. You have a fever. This information is not intended to replace advice given to you by your health care provider. Make sure you discuss any questions you have with your health care provider. Document Released: 12/19/2012 Document Revised: 09/24/2015 Document Reviewed: 10/31/2014 Elsevier Interactive Patient Education  2018 [employer]      [facility], [place] [phone]  OPEN ABDOMINAL SURGERY: POST OP INSTRUCTIONS  Always review your discharge instruction sheet given to you by the facility where your surgery was performed.  IF YOU HAVE DISABILITY OR FAMILY LEAVE FORMS, YOU MUST BRING THEM TO THE OFFICE FOR PROCESSING.  PLEASE DO NOT GIVE THEM TO YOUR DOCTOR.  1. A prescription for pain medication may be given to you upon discharge.  Take your pain medication as prescribed, if needed.  If narcotic pain medicine is not needed, then you may take acetaminophen (Tylenol) or ibuprofen (Advil) as needed. 2. Take your usually prescribed medications unless  otherwise directed. 3. If you need a refill on your pain medication, please contact your pharmacy. They will contact our office to request authorization.  Prescriptions will not be filled after 5pm or on week-ends. 4. You should follow a light diet the first few days after arrival home, such as soup and crackers, pudding, etc.unless your  doctor has advised otherwise. A high-fiber, low fat diet can be resumed as tolerated.   Be sure to include lots of fluids daily. Most patients will experience some swelling and bruising on the chest and neck area.  Ice packs will help.  Swelling and bruising can take several days to resolve 5. Most patients will experience some swelling and bruising in the area of the incision. Ice pack will help. Swelling and bruising can take several days to resolve..  6. It is common to experience some constipation if taking pain medication after surgery.  Increasing fluid intake and taking a stool softener will usually help or prevent this problem from occurring.  A mild laxative (Milk of Magnesia or Miralax) should be taken according to package directions if there are no bowel movements after 48 hours. 7.  You may have steri-strips (small skin tapes) in place directly over the incision.  These strips should be left on the skin for 7-10 days.  If your surgeon used skin glue on the incision, you may shower in 24 hours.  The glue will flake off over the next 2-3 weeks.  Any sutures or staples will be removed at the office during your follow-up visit. You may find that a light gauze bandage over your incision may keep your staples from being rubbed or pulled. You may shower and replace the bandage daily. 8. ACTIVITIES:  You may resume regular (light) daily activities beginning the next day--such as daily self-care, walking, climbing stairs--gradually increasing activities as tolerated.  You may have sexual intercourse when it is comfortable.  Refrain from any heavy lifting or straining until approved by your doctor. a. You may drive when you no longer are taking prescription pain medication, you can comfortably wear a seatbelt, and you can safely maneuver your car and apply brakes b. Return to Work: ___________________________________ 9. You should see your doctor in the office for a follow-up appointment approximately two  weeks after your surgery.  Make sure that you call for this appointment within a day or two after you arrive home to insure a convenient appointment time. OTHER INSTRUCTIONS:  _____________________________________________________________ _____________________________________________________________  WHEN TO CALL YOUR DOCTOR: 1. Fever over 101.0 2. Inability to urinate 3. Nausea and/or vomiting 4. Extreme swelling or bruising 5. Continued bleeding from incision. 6. Increased pain, redness, or drainage from the incision. 7. Difficulty swallowing or breathing 8. Muscle cramping or spasms. 9. Numbness or tingling in hands or feet or around lips.  The clinic staff is available to answer your questions during regular business hours.  Please dont hesitate to call and ask to speak to one of the nurses if you have concerns.  For further questions, please visit [email or web address]  [facility] clinic orders have been placed - patient to expect phone call from schedulers regarding date/time, will plan for 10-14 days after discharge. patient to flush drain once daily with 5 cc NS and record output once daily; insertion site to remain clean, dry and dressed - change dressing when wet or dirty. Do not submerge drain. Call IR with questions or concerns.

## 2018-04-01 DIAGNOSIS — Z48815 Encounter for surgical aftercare following surgery on the digestive system: Secondary | ICD-10-CM | POA: Diagnosis not present

## 2018-04-01 DIAGNOSIS — S72351A Displaced comminuted fracture of shaft of right femur, initial encounter for closed fracture: Secondary | ICD-10-CM | POA: Diagnosis not present

## 2018-04-02 ENCOUNTER — Other Ambulatory Visit: Payer: Self-pay | Admitting: General Surgery

## 2018-04-02 ENCOUNTER — Other Ambulatory Visit (HOSPITAL_COMMUNITY): Payer: Self-pay | Admitting: Physician Assistant

## 2018-04-02 DIAGNOSIS — Z48815 Encounter for surgical aftercare following surgery on the digestive system: Secondary | ICD-10-CM | POA: Diagnosis not present

## 2018-04-02 DIAGNOSIS — K651 Peritoneal abscess: Secondary | ICD-10-CM

## 2018-04-03 ENCOUNTER — Telehealth (HOSPITAL_COMMUNITY): Payer: Self-pay

## 2018-04-03 DIAGNOSIS — Z48815 Encounter for surgical aftercare following surgery on the digestive system: Secondary | ICD-10-CM | POA: Diagnosis not present

## 2018-04-03 DIAGNOSIS — Z4803 Encounter for change or removal of drains: Secondary | ICD-10-CM | POA: Diagnosis not present

## 2018-04-03 DIAGNOSIS — Z432 Encounter for attention to ileostomy: Secondary | ICD-10-CM | POA: Diagnosis not present

## 2018-04-03 DIAGNOSIS — Z452 Encounter for adjustment and management of vascular access device: Secondary | ICD-10-CM | POA: Diagnosis not present

## 2018-04-03 DIAGNOSIS — Z4801 Encounter for change or removal of surgical wound dressing: Secondary | ICD-10-CM | POA: Diagnosis not present

## 2018-04-03 DIAGNOSIS — K651 Peritoneal abscess: Secondary | ICD-10-CM | POA: Diagnosis not present

## 2018-04-03 DIAGNOSIS — Z9689 Presence of other specified functional implants: Secondary | ICD-10-CM | POA: Diagnosis not present

## 2018-04-03 DIAGNOSIS — Z9049 Acquired absence of other specified parts of digestive tract: Secondary | ICD-10-CM | POA: Diagnosis not present

## 2018-04-03 DIAGNOSIS — S72351D Displaced comminuted fracture of shaft of right femur, subsequent encounter for closed fracture with routine healing: Secondary | ICD-10-CM | POA: Diagnosis not present

## 2018-04-03 LAB — AEROBIC/ANAEROBIC CULTURE W GRAM STAIN (SURGICAL/DEEP WOUND)
Culture: NO GROWTH
Special Requests: NORMAL

## 2018-04-03 LAB — AEROBIC/ANAEROBIC CULTURE (SURGICAL/DEEP WOUND)

## 2018-04-03 NOTE — Telephone Encounter (Signed)
Please call Lyla SonCarrie with [facility] at [phone]. Needs orders for pt.  Ileostomy education [facility] dressing changes wound care   Thank you,  Gloris Manchesterraci  [phone]

## 2018-04-03 NOTE — Telephone Encounter (Signed)
[facility]

## 2018-04-04 DIAGNOSIS — Z48815 Encounter for surgical aftercare following surgery on the digestive system: Secondary | ICD-10-CM | POA: Diagnosis not present

## 2018-04-05 DIAGNOSIS — Z9049 Acquired absence of other specified parts of digestive tract: Secondary | ICD-10-CM | POA: Diagnosis not present

## 2018-04-05 DIAGNOSIS — Z4803 Encounter for change or removal of drains: Secondary | ICD-10-CM | POA: Diagnosis not present

## 2018-04-05 DIAGNOSIS — Z9689 Presence of other specified functional implants: Secondary | ICD-10-CM | POA: Diagnosis not present

## 2018-04-05 DIAGNOSIS — K651 Peritoneal abscess: Secondary | ICD-10-CM | POA: Diagnosis not present

## 2018-04-05 DIAGNOSIS — Z432 Encounter for attention to ileostomy: Secondary | ICD-10-CM | POA: Diagnosis not present

## 2018-04-05 DIAGNOSIS — Z48815 Encounter for surgical aftercare following surgery on the digestive system: Secondary | ICD-10-CM | POA: Diagnosis not present

## 2018-04-05 DIAGNOSIS — Z4801 Encounter for change or removal of surgical wound dressing: Secondary | ICD-10-CM | POA: Diagnosis not present

## 2018-04-05 DIAGNOSIS — Z452 Encounter for adjustment and management of vascular access device: Secondary | ICD-10-CM | POA: Diagnosis not present

## 2018-04-05 DIAGNOSIS — S72351D Displaced comminuted fracture of shaft of right femur, subsequent encounter for closed fracture with routine healing: Secondary | ICD-10-CM | POA: Diagnosis not present

## 2018-04-06 ENCOUNTER — Encounter (HOSPITAL_COMMUNITY): Payer: Self-pay

## 2018-04-06 ENCOUNTER — Other Ambulatory Visit: Payer: Self-pay

## 2018-04-06 ENCOUNTER — Emergency Department (HOSPITAL_COMMUNITY): Payer: BLUE CROSS/BLUE SHIELD

## 2018-04-06 ENCOUNTER — Inpatient Hospital Stay (HOSPITAL_COMMUNITY)
Admission: EM | Admit: 2018-04-06 | Discharge: 2018-04-10 | DRG: 690 | Disposition: A | Discharge status: Home Health | Payer: BLUE CROSS/BLUE SHIELD | Attending: General Surgery | Admitting: General Surgery

## 2018-04-06 DIAGNOSIS — Z883 Allergy status to other anti-infective agents status: Secondary | ICD-10-CM

## 2018-04-06 DIAGNOSIS — N3001 Acute cystitis with hematuria: Secondary | ICD-10-CM | POA: Diagnosis not present

## 2018-04-06 DIAGNOSIS — R1030 Lower abdominal pain, unspecified: Secondary | ICD-10-CM | POA: Diagnosis not present

## 2018-04-06 DIAGNOSIS — R1031 Right lower quadrant pain: Secondary | ICD-10-CM | POA: Diagnosis not present

## 2018-04-06 DIAGNOSIS — Z932 Ileostomy status: Secondary | ICD-10-CM

## 2018-04-06 DIAGNOSIS — S72351A Displaced comminuted fracture of shaft of right femur, initial encounter for closed fracture: Secondary | ICD-10-CM | POA: Diagnosis not present

## 2018-04-06 DIAGNOSIS — R188 Other ascites: Secondary | ICD-10-CM | POA: Diagnosis not present

## 2018-04-06 DIAGNOSIS — L989 Disorder of the skin and subcutaneous tissue, unspecified: Secondary | ICD-10-CM | POA: Diagnosis not present

## 2018-04-06 DIAGNOSIS — S31109A Unspecified open wound of abdominal wall, unspecified quadrant without penetration into peritoneal cavity, initial encounter: Secondary | ICD-10-CM | POA: Diagnosis not present

## 2018-04-06 DIAGNOSIS — Z79899 Other long term (current) drug therapy: Secondary | ICD-10-CM

## 2018-04-06 DIAGNOSIS — G8918 Other acute postprocedural pain: Secondary | ICD-10-CM | POA: Diagnosis present

## 2018-04-06 DIAGNOSIS — N390 Urinary tract infection, site not specified: Secondary | ICD-10-CM | POA: Diagnosis present

## 2018-04-06 DIAGNOSIS — K651 Peritoneal abscess: Secondary | ICD-10-CM | POA: Diagnosis not present

## 2018-04-06 LAB — COMPREHENSIVE METABOLIC PANEL
ALT: 138 U/L — ABNORMAL HIGH (ref 0–44)
AST: 68 U/L — ABNORMAL HIGH (ref 15–41)
Albumin: 3.7 g/dL (ref 3.5–5.0)
Alkaline Phosphatase: 325 U/L — ABNORMAL HIGH (ref 38–126)
Anion gap: 16 — ABNORMAL HIGH (ref 5–15)
BUN: 15 mg/dL (ref 6–20)
CO2: 22 mmol/L (ref 22–32)
Calcium: 10.3 mg/dL (ref 8.9–10.3)
Chloride: 96 mmol/L — ABNORMAL LOW (ref 98–111)
Creatinine, Ser: 1.02 mg/dL (ref 0.61–1.24)
GFR calc Af Amer: >60 mL/min (ref >60)
GFR calc non Af Amer: >60 mL/min (ref >60)
Glucose, Bld: 139 mg/dL — ABNORMAL HIGH (ref 70–99)
Potassium: 4.6 mmol/L (ref 3.5–5.1)
Sodium: 134 mmol/L — ABNORMAL LOW (ref 135–145)
Total Bilirubin: 1.6 mg/dL — ABNORMAL HIGH (ref 0.3–1.2)
Total Protein: 9.6 g/dL — ABNORMAL HIGH (ref 6.5–8.1)

## 2018-04-06 LAB — CBC
HCT: 39.9 % (ref 39.0–52.0)
Hemoglobin: 12.2 g/dL — ABNORMAL LOW (ref 13.0–17.0)
MCH: 26.3 pg (ref 26.0–34.0)
MCHC: 30.6 g/dL (ref 30.0–36.0)
MCV: 86.2 fL (ref 80.0–100.0)
Platelets: 646 10*3/uL — ABNORMAL HIGH (ref 150–400)
RBC: 4.63 MIL/uL (ref 4.22–5.81)
RDW: 14.7 % (ref 11.5–15.5)
WBC: 14.9 10*3/uL — ABNORMAL HIGH (ref 4.0–10.5)
nRBC: 0 % (ref 0.0–0.2)

## 2018-04-06 LAB — URINALYSIS, ROUTINE W REFLEX MICROSCOPIC
Glucose, UA: NEGATIVE mg/dL
Ketones, ur: 15 mg/dL — AB
Nitrite: POSITIVE — AB
Protein, ur: >300 mg/dL — AB
Specific Gravity, Urine: 1.02 (ref 1.005–1.030)
pH: 6.5 (ref 5.0–8.0)

## 2018-04-06 LAB — LIPASE, BLOOD: Lipase: 56 U/L — ABNORMAL HIGH (ref 11–51)

## 2018-04-06 LAB — I-STAT CG4 LACTIC ACID, ED: Lactic Acid, Venous: 1.15 mmol/L (ref 0.5–1.9)

## 2018-04-06 LAB — URINALYSIS, MICROSCOPIC (REFLEX)
RBC / HPF: >50 RBC/hpf (ref 0–5)
Squamous Epithelial / HPF: NONE SEEN (ref 0–5)

## 2018-04-06 MED ORDER — ACETAMINOPHEN 325 MG PO TABS: 650.0000 mg | ORAL_TABLET | ORAL | Status: DC | PRN

## 2018-04-06 MED ORDER — ONDANSETRON HCL 4 MG/2ML IJ SOLN
4.0000 mg | Freq: Four times a day (QID) | INTRAMUSCULAR | Status: DC | PRN
  Administered 2018-04-07 – 2018-04-10 (×3): 4 mg via INTRAVENOUS
  Filled 2018-04-06 (×3): qty 2

## 2018-04-06 MED ORDER — IOHEXOL 300 MG/ML  SOLN
100.0000 mL | Freq: Once | INTRAMUSCULAR | Status: AC | PRN
  Administered 2018-04-06: 100 mL via INTRAVENOUS

## 2018-04-06 MED ORDER — SODIUM CHLORIDE 0.9 % IV SOLN
2.0000 g | INTRAVENOUS | Status: DC
  Administered 2018-04-06 – 2018-04-07 (×2): 2 g via INTRAVENOUS
  Filled 2018-04-06 (×2): qty 20

## 2018-04-06 MED ORDER — METRONIDAZOLE IN NACL 5-0.79 MG/ML-% IV SOLN
500.0000 mg | Freq: Three times a day (TID) | INTRAVENOUS | Status: DC
  Administered 2018-04-06 – 2018-04-08 (×6): 500 mg via INTRAVENOUS
  Filled 2018-04-06 (×6): qty 100

## 2018-04-06 MED ORDER — OXYCODONE HCL 5 MG PO TABS
10.0000 mg | ORAL_TABLET | ORAL | Status: DC | PRN
  Administered 2018-04-06 – 2018-04-08 (×7): 10 mg via ORAL
  Filled 2018-04-06 (×8): qty 2

## 2018-04-06 MED ORDER — FENTANYL CITRATE (PF) 100 MCG/2ML IJ SOLN
50.0000 ug | Freq: Once | INTRAMUSCULAR | Status: AC
  Administered 2018-04-06: 50 ug via INTRAVENOUS
  Filled 2018-04-06: qty 2

## 2018-04-06 MED ORDER — HYDROMORPHONE HCL 1 MG/ML IJ SOLN
1.0000 mg | Freq: Once | INTRAMUSCULAR | Status: AC
  Administered 2018-04-06: 1 mg via INTRAVENOUS
  Filled 2018-04-06: qty 1

## 2018-04-06 MED ORDER — SODIUM CHLORIDE 0.9 % IV SOLN
INTRAVENOUS | Status: DC
  Administered 2018-04-06: 20:00:00 via INTRAVENOUS

## 2018-04-06 MED ORDER — ONDANSETRON HCL 4 MG/2ML IJ SOLN
4.0000 mg | Freq: Once | INTRAMUSCULAR | Status: AC
  Administered 2018-04-06: 4 mg via INTRAVENOUS
  Filled 2018-04-06: qty 2

## 2018-04-06 MED ORDER — ONDANSETRON 4 MG PO TBDP: 4.0000 mg | ORAL_TABLET | Freq: Four times a day (QID) | ORAL | Status: DC | PRN

## 2018-04-06 MED ORDER — OXYCODONE HCL 5 MG PO TABS
5.0000 mg | ORAL_TABLET | ORAL | Status: DC | PRN
  Filled 2018-04-06: qty 1

## 2018-04-06 MED ORDER — SODIUM CHLORIDE 0.9 % IV BOLUS
1000.0000 mL | Freq: Once | INTRAVENOUS | Status: AC
  Administered 2018-04-06: 1000 mL via INTRAVENOUS

## 2018-04-06 MED ORDER — HEPARIN SODIUM (PORCINE) 5000 UNIT/ML IJ SOLN
5000.0000 [IU] | Freq: Three times a day (TID) | INTRAMUSCULAR | Status: DC
  Filled 2018-04-06 (×6): qty 1

## 2018-04-06 MED ORDER — TRAMADOL HCL 50 MG PO TABS
50.0000 mg | ORAL_TABLET | Freq: Four times a day (QID) | ORAL | Status: DC | PRN
  Administered 2018-04-07: 50 mg via ORAL
  Filled 2018-04-06: qty 1

## 2018-04-06 MED ORDER — MORPHINE SULFATE (PF) 4 MG/ML IV SOLN
4.0000 mg | Freq: Once | INTRAVENOUS | Status: AC
  Administered 2018-04-06: 4 mg via INTRAVENOUS
  Filled 2018-04-06: qty 1

## 2018-04-06 MED ORDER — HYDROMORPHONE HCL 1 MG/ML IJ SOLN
0.5000 mg | INTRAMUSCULAR | Status: DC | PRN
  Administered 2018-04-06 – 2018-04-09 (×12): 0.5 mg via INTRAVENOUS
  Filled 2018-04-06 (×12): qty 1

## 2018-04-06 MED ORDER — METOPROLOL TARTRATE 25 MG PO TABS
25.0000 mg | ORAL_TABLET | Freq: Two times a day (BID) | ORAL | Status: DC
  Administered 2018-04-06 – 2018-04-10 (×7): 25 mg via ORAL
  Filled 2018-04-06 (×8): qty 1

## 2018-04-06 NOTE — ED Notes (Signed)
Report given to rn on 4n 
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
## 2018-04-06 NOTE — ED Triage Notes (Signed)
Pt reports sharp abd pain at surgical incision site. Pt was discharged on Saturday from admission from [facility]. Pt had exploratory laparotomy done with ileostomy place, JP drains in place. Pt also reports some blood in his urine. Pt taking oxycodone and tramadol at home without relief of pain, nausea at times.
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
## 2018-04-07 ENCOUNTER — Encounter (HOSPITAL_COMMUNITY): Payer: Self-pay

## 2018-04-07 DIAGNOSIS — Z883 Allergy status to other anti-infective agents status: Secondary | ICD-10-CM | POA: Diagnosis not present

## 2018-04-07 DIAGNOSIS — Z79899 Other long term (current) drug therapy: Secondary | ICD-10-CM | POA: Diagnosis not present

## 2018-04-07 DIAGNOSIS — R1030 Lower abdominal pain, unspecified: Secondary | ICD-10-CM | POA: Diagnosis present

## 2018-04-07 DIAGNOSIS — Z932 Ileostomy status: Secondary | ICD-10-CM | POA: Diagnosis not present

## 2018-04-07 DIAGNOSIS — N3001 Acute cystitis with hematuria: Secondary | ICD-10-CM | POA: Diagnosis present

## 2018-04-07 DIAGNOSIS — G8918 Other acute postprocedural pain: Secondary | ICD-10-CM | POA: Diagnosis present

## 2018-04-07 DIAGNOSIS — R188 Other ascites: Secondary | ICD-10-CM | POA: Diagnosis present

## 2018-04-07 LAB — COMPREHENSIVE METABOLIC PANEL
ALT: 99 U/L — ABNORMAL HIGH (ref 0–44)
ANION GAP: 10 (ref 5–15)
AST: 39 U/L (ref 15–41)
Albumin: 2.7 g/dL — ABNORMAL LOW (ref 3.5–5.0)
Alkaline Phosphatase: 241 U/L — ABNORMAL HIGH (ref 38–126)
BUN: 10 mg/dL (ref 6–20)
CO2: 24 mmol/L (ref 22–32)
Calcium: 9 mg/dL (ref 8.9–10.3)
Chloride: 102 mmol/L (ref 98–111)
Creatinine, Ser: 0.92 mg/dL (ref 0.61–1.24)
GFR calc Af Amer: >60 mL/min (ref >60)
GFR calc non Af Amer: >60 mL/min (ref >60)
Glucose, Bld: 106 mg/dL — ABNORMAL HIGH (ref 70–99)
POTASSIUM: 4.4 mmol/L (ref 3.5–5.1)
SODIUM: 136 mmol/L (ref 135–145)
Total Bilirubin: 1.2 mg/dL (ref 0.3–1.2)
Total Protein: 7.1 g/dL (ref 6.5–8.1)

## 2018-04-07 LAB — CBC
HCT: 30.6 % — ABNORMAL LOW (ref 39.0–52.0)
Hemoglobin: 9.3 g/dL — ABNORMAL LOW (ref 13.0–17.0)
MCH: 26.1 pg (ref 26.0–34.0)
MCHC: 30.4 g/dL (ref 30.0–36.0)
MCV: 86 fL (ref 80.0–100.0)
Platelets: 398 10*3/uL (ref 150–400)
RBC: 3.56 MIL/uL — ABNORMAL LOW (ref 4.22–5.81)
RDW: 14.6 % (ref 11.5–15.5)
WBC: 10 10*3/uL (ref 4.0–10.5)
nRBC: 0 % (ref 0.0–0.2)

## 2018-04-07 MED ORDER — ACETAMINOPHEN 500 MG PO TABS
1000.0000 mg | ORAL_TABLET | Freq: Four times a day (QID) | ORAL | Status: AC
  Administered 2018-04-07 – 2018-04-08 (×4): 1000 mg via ORAL
  Filled 2018-04-07 (×4): qty 2

## 2018-04-07 MED ORDER — METHOCARBAMOL 500 MG PO TABS
500.0000 mg | ORAL_TABLET | Freq: Three times a day (TID) | ORAL | Status: DC
  Administered 2018-04-07 – 2018-04-09 (×7): 500 mg via ORAL
  Filled 2018-04-07 (×7): qty 1

## 2018-04-07 MED ORDER — KETOROLAC TROMETHAMINE 15 MG/ML IJ SOLN
15.0000 mg | Freq: Three times a day (TID) | INTRAMUSCULAR | Status: DC | PRN
  Administered 2018-04-07 – 2018-04-09 (×3): 15 mg via INTRAVENOUS
  Filled 2018-04-07 (×3): qty 1

## 2018-04-07 NOTE — Progress Notes (Signed)
While giving medications today I overheard patient talking to family members about an appointment that they were going to have difficulty getting him transported to the facility. Patient told family members that he would make sure he stayed until Wednesday to get his pain under control.
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
## 2018-04-08 LAB — BASIC METABOLIC PANEL
ANION GAP: 14 (ref 5–15)
BUN: 11 mg/dL (ref 6–20)
CO2: 22 mmol/L (ref 22–32)
Calcium: 9 mg/dL (ref 8.9–10.3)
Chloride: 99 mmol/L (ref 98–111)
Creatinine, Ser: 0.83 mg/dL (ref 0.61–1.24)
GFR calc Af Amer: >60 mL/min (ref >60)
GFR calc non Af Amer: >60 mL/min (ref >60)
Glucose, Bld: 106 mg/dL — ABNORMAL HIGH (ref 70–99)
Potassium: 3.9 mmol/L (ref 3.5–5.1)
Sodium: 135 mmol/L (ref 135–145)

## 2018-04-08 LAB — URINE CULTURE: Culture: <10000 — AB

## 2018-04-08 MED ORDER — GABAPENTIN 300 MG PO CAPS
300.0000 mg | ORAL_CAPSULE | Freq: Three times a day (TID) | ORAL | Status: DC
  Administered 2018-04-08 – 2018-04-10 (×7): 300 mg via ORAL
  Filled 2018-04-08 (×7): qty 1

## 2018-04-08 MED ORDER — CIPROFLOXACIN HCL 500 MG PO TABS
500.0000 mg | ORAL_TABLET | Freq: Two times a day (BID) | ORAL | Status: DC
  Administered 2018-04-08 – 2018-04-10 (×4): 500 mg via ORAL
  Filled 2018-04-08 (×4): qty 1

## 2018-04-08 MED ORDER — METRONIDAZOLE 500 MG PO TABS
500.0000 mg | ORAL_TABLET | Freq: Three times a day (TID) | ORAL | Status: DC
  Administered 2018-04-08 – 2018-04-10 (×6): 500 mg via ORAL
  Filled 2018-04-08 (×6): qty 1

## 2018-04-08 NOTE — Progress Notes (Signed)
Patient with complaints of 5/10 pain at this time.  Informed him that he could have the oxycodone at this time.  Patient declined stated it does not work at all and that he would wait till he could have the IV dilaudid.  Informed patient that it was not due until 1800.

## 2018-04-08 NOTE — Care Management (Signed)
Consult received to help patient obtain wound care and ileostomy supplies.    Pt's [facility] and relayed that [facility] was not able to get supplies for patient this week and patient was discharged with sufficient supplies.  She states wound care and ileostomy supplies were delivered to the home yesterday.  She is unsure that they can use the ileostomy supplies because they are different.  Encouraged her to bring to the hospital tomorrow to let staff/ostomy nurse help.    During conversation, they mentioned that patient has [employer] commercial insurance through his employer.  Pt's insurance card is scanned into Epic, but not entered into Epic.    Discussed with charge nurse, Crystal, that mother-in-law has various complaints.  Hopefully, staff tomorrow can ensure that patient's insurance information is entered, ensure that their questions about ileostomy supplies can be answered, and that the patient experience team can speak to patient or mother-in-law about various complaints.

## 2018-04-08 NOTE — Plan of Care (Signed)
  Problem: Education: Goal: Knowledge of General Education information will improve Description Including pain rating scale, medication(s)/side effects and non-pharmacologic comfort measures Outcome: Progressing   Problem: Health Behavior/Discharge Planning: Goal: Ability to manage health-related needs will improve Outcome: Progressing   Problem: Clinical Measurements: Goal: Ability to maintain clinical measurements within normal limits will improve Outcome: Progressing Goal: Will remain free from infection Outcome: Progressing Goal: Diagnostic test results will improve Outcome: Progressing Goal: Respiratory complications will improve Outcome: Progressing Goal: Cardiovascular complication will be avoided Outcome: Progressing   Problem: Activity: Goal: Risk for activity intolerance will decrease Outcome: Progressing   Problem: Nutrition: Goal: Adequate nutrition will be maintained Outcome: Progressing   Problem: Coping: Goal: Level of anxiety will decrease Outcome: Progressing   Problem: Elimination: Goal: Will not experience complications related to bowel motility Outcome: Progressing Goal: Will not experience complications related to urinary retention Outcome: Progressing   Problem: Safety: Goal: Ability to remain free from injury will improve Outcome: Progressing   Problem: Skin Integrity: Goal: Risk for impaired skin integrity will decrease Outcome: Progressing   Problem: Pain Managment: Goal: General experience of comfort will improve Outcome: Not Progressing Note:  Continues to need multiple doses of pain medication throughout shift.  PO and IV forms.  States only the IV dilaudid actually eases his pain.
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
## 2018-04-09 MED ORDER — HYDROMORPHONE HCL 1 MG/ML IJ SOLN
0.5000 mg | Freq: Three times a day (TID) | INTRAMUSCULAR | Status: DC | PRN
  Administered 2018-04-09: 0.5 mg via INTRAVENOUS
  Filled 2018-04-09: qty 1

## 2018-04-09 MED ORDER — TRAMADOL HCL 50 MG PO TABS
50.0000 mg | ORAL_TABLET | Freq: Four times a day (QID) | ORAL | Status: DC | PRN
  Administered 2018-04-10: 50 mg via ORAL
  Filled 2018-04-09: qty 1

## 2018-04-09 MED ORDER — ACETAMINOPHEN 500 MG PO TABS
1000.0000 mg | ORAL_TABLET | Freq: Four times a day (QID) | ORAL | Status: DC
  Administered 2018-04-09 – 2018-04-10 (×4): 1000 mg via ORAL
  Filled 2018-04-09 (×5): qty 2

## 2018-04-09 MED ORDER — OXYCODONE HCL 5 MG PO TABS
10.0000 mg | ORAL_TABLET | ORAL | Status: DC | PRN
  Administered 2018-04-09 (×2): 15 mg via ORAL
  Administered 2018-04-09: 10 mg via ORAL
  Administered 2018-04-10 (×2): 15 mg via ORAL
  Filled 2018-04-09 (×6): qty 3

## 2018-04-09 MED ORDER — HYDROMORPHONE HCL 1 MG/ML IJ SOLN: 0.5000 mg | Freq: Four times a day (QID) | INTRAMUSCULAR | Status: DC | PRN

## 2018-04-09 NOTE — Consult Note (Addendum)
WOC Nurse wound consult note Reason for Consult: Consult requested to provide topical treatment orders for abd wound.  [facility] prolonged [facility], and is currently being followed by the surgical team.  He has been using Iodoform gauze to pack the deeper area on his abd,and moist gauze to the wound with abd pads and tape. Wound type: Full thickness midline abd wound Measurement:deeper area in the center of the wound is red and moist; .3X.3X1cm.  Entire wound is beefy red; .8X3.5X3cm, mod amt yellow drainage, no odor. Dressing procedure/placement/frequency: Continue present plan of care as stated above.  WOC Nurse ostomy consult note Pt states pouch was recently changed.  It is intact with a good seal so stoma was not visualized.  He had ileostomy surgery several months ago and states he and his family members have been independent with pouch application and emptying when at home. He denies peristomal complications and states his pouch wear time is 3-4 days. Output: Large amt liquid green stool  Ostomy pouching: 1pc. Flexible convex pouch with barrier ring to maintain a seal  Education provided: He denies further questions regarding ostomy care at this time.  Extra supplies ordered to the room for bedside nurse and patient use. Enrolled patient in [employer] DC program: Yes, during previous admission. Please re-consult if further assistance is needed.  Thank-you,  Cammie Mcgeeawn Estephany Perot MSN, RN, CWOCN, [place]CN-AP, CNS [phone]
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
## 2018-04-09 NOTE — Progress Notes (Signed)
  Most recent imaging reviewed by Dr. Lowella DandyHenn.  Only about 5 mL output per day.  Dr. Lowella DandyHenn recommends removing drain.  Drain removed today at bedside without difficulty.  Clean dressing placed.  Gwynneth MacleodWENDY S Tien Aispuro PA-C 04/09/2018 4:33 PM

## 2018-04-10 MED ORDER — GABAPENTIN 300 MG PO CAPS: 300.0000 mg | ORAL_CAPSULE | Freq: Three times a day (TID) | ORAL | 0 refills | Status: DC

## 2018-04-10 MED ORDER — OXYCODONE HCL 10 MG PO TABS: 10.0000 mg | ORAL_TABLET | ORAL | 0 refills | Status: AC | PRN

## 2018-04-10 MED ORDER — CIPROFLOXACIN HCL 500 MG PO TABS: 500.0000 mg | ORAL_TABLET | Freq: Two times a day (BID) | ORAL | 0 refills | Status: AC

## 2018-04-10 MED ORDER — METRONIDAZOLE 500 MG PO TABS: 500.0000 mg | ORAL_TABLET | Freq: Three times a day (TID) | ORAL | 0 refills | Status: AC

## 2018-04-10 MED ORDER — ACETAMINOPHEN 500 MG PO TABS: 1000.0000 mg | ORAL_TABLET | Freq: Four times a day (QID) | ORAL | 0 refills | Status: DC | PRN

## 2018-04-10 MED ORDER — OXYCODONE HCL 10 MG PO TABS: 10.0000 mg | ORAL_TABLET | ORAL | 0 refills | Status: DC | PRN
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
## 2018-04-10 NOTE — Progress Notes (Signed)
Discharge instructions reviewed with patient.  Patient given written prescription for Oxycodone and Gabapentin.  IV removed.  Patient to be discharged home via fiance.

## 2018-04-11 LAB — CULTURE, BLOOD (ROUTINE X 2)
Culture: NO GROWTH
Culture: NO GROWTH
Special Requests: ADEQUATE
Special Requests: ADEQUATE

## 2018-04-12 ENCOUNTER — Other Ambulatory Visit: Payer: BLUE CROSS/BLUE SHIELD

## 2018-04-12 DIAGNOSIS — K651 Peritoneal abscess: Secondary | ICD-10-CM | POA: Diagnosis not present

## 2018-04-12 DIAGNOSIS — Z9049 Acquired absence of other specified parts of digestive tract: Secondary | ICD-10-CM | POA: Diagnosis not present

## 2018-04-12 DIAGNOSIS — Z9689 Presence of other specified functional implants: Secondary | ICD-10-CM | POA: Diagnosis not present

## 2018-04-12 DIAGNOSIS — Z4803 Encounter for change or removal of drains: Secondary | ICD-10-CM | POA: Diagnosis not present

## 2018-04-12 DIAGNOSIS — Z432 Encounter for attention to ileostomy: Secondary | ICD-10-CM | POA: Diagnosis not present

## 2018-04-12 DIAGNOSIS — Z48815 Encounter for surgical aftercare following surgery on the digestive system: Secondary | ICD-10-CM | POA: Diagnosis not present

## 2018-04-12 DIAGNOSIS — S72351D Displaced comminuted fracture of shaft of right femur, subsequent encounter for closed fracture with routine healing: Secondary | ICD-10-CM | POA: Diagnosis not present

## 2018-04-12 DIAGNOSIS — Z452 Encounter for adjustment and management of vascular access device: Secondary | ICD-10-CM | POA: Diagnosis not present

## 2018-04-12 DIAGNOSIS — Z4801 Encounter for change or removal of surgical wound dressing: Secondary | ICD-10-CM | POA: Diagnosis not present

## 2018-04-13 DIAGNOSIS — Z4803 Encounter for change or removal of drains: Secondary | ICD-10-CM | POA: Diagnosis not present

## 2018-04-13 DIAGNOSIS — K651 Peritoneal abscess: Secondary | ICD-10-CM | POA: Diagnosis not present

## 2018-04-13 DIAGNOSIS — Z9049 Acquired absence of other specified parts of digestive tract: Secondary | ICD-10-CM | POA: Diagnosis not present

## 2018-04-13 DIAGNOSIS — Z452 Encounter for adjustment and management of vascular access device: Secondary | ICD-10-CM | POA: Diagnosis not present

## 2018-04-13 DIAGNOSIS — Z48815 Encounter for surgical aftercare following surgery on the digestive system: Secondary | ICD-10-CM | POA: Diagnosis not present

## 2018-04-13 DIAGNOSIS — Z9689 Presence of other specified functional implants: Secondary | ICD-10-CM | POA: Diagnosis not present

## 2018-04-13 DIAGNOSIS — Z432 Encounter for attention to ileostomy: Secondary | ICD-10-CM | POA: Diagnosis not present

## 2018-04-13 DIAGNOSIS — S72351D Displaced comminuted fracture of shaft of right femur, subsequent encounter for closed fracture with routine healing: Secondary | ICD-10-CM | POA: Diagnosis not present

## 2018-04-13 DIAGNOSIS — Z4801 Encounter for change or removal of surgical wound dressing: Secondary | ICD-10-CM | POA: Diagnosis not present

## 2018-04-18 ENCOUNTER — Emergency Department (HOSPITAL_COMMUNITY): Payer: BLUE CROSS/BLUE SHIELD

## 2018-04-18 ENCOUNTER — Encounter (HOSPITAL_COMMUNITY): Payer: Self-pay | Admitting: Emergency Medicine

## 2018-04-18 ENCOUNTER — Other Ambulatory Visit: Payer: Self-pay

## 2018-04-18 ENCOUNTER — Emergency Department (HOSPITAL_COMMUNITY)
Admission: EM | Admit: 2018-04-18 | Discharge: 2018-04-18 | Disposition: A | Discharge status: Home / Self Care | Payer: BLUE CROSS/BLUE SHIELD | Attending: Emergency Medicine | Admitting: Emergency Medicine

## 2018-04-18 DIAGNOSIS — L2489 Irritant contact dermatitis due to other agents: Secondary | ICD-10-CM | POA: Diagnosis not present

## 2018-04-18 DIAGNOSIS — S31109A Unspecified open wound of abdominal wall, unspecified quadrant without penetration into peritoneal cavity, initial encounter: Secondary | ICD-10-CM | POA: Diagnosis not present

## 2018-04-18 DIAGNOSIS — L989 Disorder of the skin and subcutaneous tissue, unspecified: Secondary | ICD-10-CM | POA: Diagnosis not present

## 2018-04-18 DIAGNOSIS — L249 Irritant contact dermatitis, unspecified cause: Secondary | ICD-10-CM | POA: Diagnosis not present

## 2018-04-18 DIAGNOSIS — Z932 Ileostomy status: Secondary | ICD-10-CM | POA: Diagnosis not present

## 2018-04-18 DIAGNOSIS — S72351A Displaced comminuted fracture of shaft of right femur, initial encounter for closed fracture: Secondary | ICD-10-CM | POA: Diagnosis not present

## 2018-04-18 DIAGNOSIS — R109 Unspecified abdominal pain: Secondary | ICD-10-CM | POA: Diagnosis not present

## 2018-04-18 DIAGNOSIS — K9419 Other complications of enterostomy: Secondary | ICD-10-CM | POA: Diagnosis not present

## 2018-04-18 LAB — URINALYSIS, MICROSCOPIC (REFLEX): RBC / HPF: >50 RBC/hpf (ref 0–5)

## 2018-04-18 LAB — CBC
HCT: 36.4 % — ABNORMAL LOW (ref 39.0–52.0)
Hemoglobin: 11.4 g/dL — ABNORMAL LOW (ref 13.0–17.0)
MCH: 26.4 pg (ref 26.0–34.0)
MCHC: 31.3 g/dL (ref 30.0–36.0)
MCV: 84.3 fL (ref 80.0–100.0)
Platelets: 382 10*3/uL (ref 150–400)
RBC: 4.32 MIL/uL (ref 4.22–5.81)
RDW: 14 % (ref 11.5–15.5)
WBC: 10 10*3/uL (ref 4.0–10.5)
nRBC: 0 % (ref 0.0–0.2)

## 2018-04-18 LAB — COMPREHENSIVE METABOLIC PANEL
ALT: 123 U/L — ABNORMAL HIGH (ref 0–44)
AST: 66 U/L — ABNORMAL HIGH (ref 15–41)
Albumin: 3.4 g/dL — ABNORMAL LOW (ref 3.5–5.0)
Alkaline Phosphatase: 499 U/L — ABNORMAL HIGH (ref 38–126)
Anion gap: 12 (ref 5–15)
BUN: 11 mg/dL (ref 6–20)
CALCIUM: 9.2 mg/dL (ref 8.9–10.3)
CO2: 19 mmol/L — ABNORMAL LOW (ref 22–32)
Chloride: 103 mmol/L (ref 98–111)
Creatinine, Ser: 0.8 mg/dL (ref 0.61–1.24)
GFR calc Af Amer: >60 mL/min (ref >60)
GFR calc non Af Amer: >60 mL/min (ref >60)
Glucose, Bld: 107 mg/dL — ABNORMAL HIGH (ref 70–99)
Potassium: 4.2 mmol/L (ref 3.5–5.1)
Sodium: 134 mmol/L — ABNORMAL LOW (ref 135–145)
Total Bilirubin: 1 mg/dL (ref 0.3–1.2)
Total Protein: 7.3 g/dL (ref 6.5–8.1)

## 2018-04-18 LAB — URINALYSIS, ROUTINE W REFLEX MICROSCOPIC
Bilirubin Urine: NEGATIVE
Glucose, UA: NEGATIVE mg/dL
KETONES UR: NEGATIVE mg/dL
Nitrite: NEGATIVE
Protein, ur: 100 mg/dL — AB
Specific Gravity, Urine: 1.02 (ref 1.005–1.030)
pH: 6 (ref 5.0–8.0)

## 2018-04-18 LAB — I-STAT CG4 LACTIC ACID, ED: Lactic Acid, Venous: 4.43 mmol/L (ref 0.5–1.9)

## 2018-04-18 MED ORDER — ONDANSETRON 4 MG PO TBDP
4.0000 mg | ORAL_TABLET | Freq: Once | ORAL | Status: AC
  Administered 2018-04-18: 4 mg via ORAL
  Filled 2018-04-18: qty 1

## 2018-04-18 MED ORDER — SODIUM CHLORIDE 0.9 % IV BOLUS
1000.0000 mL | Freq: Once | INTRAVENOUS | Status: AC
  Administered 2018-04-18: 1000 mL via INTRAVENOUS

## 2018-04-18 MED ORDER — OXYCODONE HCL 5 MG PO TABS
5.0000 mg | ORAL_TABLET | Freq: Once | ORAL | Status: AC
  Administered 2018-04-18: 5 mg via ORAL
  Filled 2018-04-18: qty 1
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
## 2018-04-18 NOTE — ED Notes (Signed)
Wound care RN, paged.
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
## 2018-04-18 NOTE — Consult Note (Signed)
WOC Nurse wound consult note Supplies arrived and pouch change and teaching with parents and patient completed. Reason for Consult: leaking ostomy Wound type: RLQ ileostomy  Measurement: 1 inch top to bottom, 1 1/4 inches side to side. Wound bed: Skin irritation around stoma related to ileostomy effluent leakage due to the patient not using a barrier ring, not using a convex pouch, and not wearing his ostomy belt.  All of these products, along with stoma powder and skin prep pads for crusting were provided.  The existing flat, one piece pouch was removed.  There was no barrier ring present.  The removal process, peristomal cleansing, crusting, application of the barrier ring, and cutting the one piece, flexible convex pouch and placement were demonstrated.  All questions of the patient and his parents were answered to their expressed satisfaction.  Extra pouches, rings,skin prep pads and a bottle of stoma powder were provided.  I understand the likelihood is that the patient will not be admitted.  Thank you for the consult.  Discussed plan of care with the patient and bedside nurse.  WOC nurse will not follow at this time.  Please re-consult the WOC team if needed.  Helmut MusterSherry Ahmya Bernick, RN, MSN, CWOCN, CNS-BC, pager [phone]

## 2018-04-18 NOTE — ED Notes (Signed)
Pt stable, ambulatory, and verbalizes understanding of d/c instructions.   Pt's parents will transport pt home.

## 2018-04-18 NOTE — ED Notes (Signed)
Wound/ostomy RN bedside. States she will put in order for supplies. Requested to be paged once supplies arrive.  [phone]

## 2018-04-18 NOTE — Discharge Instructions (Addendum)
You were evaluated in the Emergency Department and after careful evaluation, we did not find any emergent condition requiring admission or further testing in the hospital.  Your symptoms today seem to be due to skin irritation related to the leaking ileostomy bag.  Please follow-up with your general surgeon in clinic.  Your surgeon wants you to go to the clinic the day before your appointment for repeat blood testing.  Please return to the Emergency Department if you experience any worsening of your condition.  We encourage you to follow up with a primary care provider.  Thank you for allowing us to be a part of your care.

## 2018-04-18 NOTE — Consult Note (Signed)
WOC Nurse wound consult note [facility] with his mother and father.  The parents were not included in any WOC teaching sessions according to them, as the patient, his fiance and her mother "took everything over".  They are now requesting teaching and instruction.  Orders for supplies have been entered.  A WOC will return once supplies are available for use.  Helmut MusterSherry Christyann Manolis, RN, MSN, CWOCN, CNS-BC, pager [phone]

## 2018-04-19 DIAGNOSIS — S72351D Displaced comminuted fracture of shaft of right femur, subsequent encounter for closed fracture with routine healing: Secondary | ICD-10-CM | POA: Diagnosis not present

## 2018-04-19 DIAGNOSIS — Z48815 Encounter for surgical aftercare following surgery on the digestive system: Secondary | ICD-10-CM | POA: Diagnosis not present

## 2018-04-19 DIAGNOSIS — Z432 Encounter for attention to ileostomy: Secondary | ICD-10-CM | POA: Diagnosis not present

## 2018-04-19 DIAGNOSIS — Z9689 Presence of other specified functional implants: Secondary | ICD-10-CM | POA: Diagnosis not present

## 2018-04-19 DIAGNOSIS — Z4803 Encounter for change or removal of drains: Secondary | ICD-10-CM | POA: Diagnosis not present

## 2018-04-19 DIAGNOSIS — Z9049 Acquired absence of other specified parts of digestive tract: Secondary | ICD-10-CM | POA: Diagnosis not present

## 2018-04-19 DIAGNOSIS — Z452 Encounter for adjustment and management of vascular access device: Secondary | ICD-10-CM | POA: Diagnosis not present

## 2018-04-19 DIAGNOSIS — Z4801 Encounter for change or removal of surgical wound dressing: Secondary | ICD-10-CM | POA: Diagnosis not present

## 2018-04-19 DIAGNOSIS — K651 Peritoneal abscess: Secondary | ICD-10-CM | POA: Diagnosis not present

## 2018-04-20 DIAGNOSIS — S72351D Displaced comminuted fracture of shaft of right femur, subsequent encounter for closed fracture with routine healing: Secondary | ICD-10-CM | POA: Diagnosis not present

## 2018-04-20 DIAGNOSIS — K651 Peritoneal abscess: Secondary | ICD-10-CM | POA: Diagnosis not present

## 2018-04-20 DIAGNOSIS — Z432 Encounter for attention to ileostomy: Secondary | ICD-10-CM | POA: Diagnosis not present

## 2018-04-20 DIAGNOSIS — Z4803 Encounter for change or removal of drains: Secondary | ICD-10-CM | POA: Diagnosis not present

## 2018-04-20 DIAGNOSIS — Z452 Encounter for adjustment and management of vascular access device: Secondary | ICD-10-CM | POA: Diagnosis not present

## 2018-04-20 DIAGNOSIS — Z9689 Presence of other specified functional implants: Secondary | ICD-10-CM | POA: Diagnosis not present

## 2018-04-20 DIAGNOSIS — Z48815 Encounter for surgical aftercare following surgery on the digestive system: Secondary | ICD-10-CM | POA: Diagnosis not present

## 2018-04-20 DIAGNOSIS — Z9049 Acquired absence of other specified parts of digestive tract: Secondary | ICD-10-CM | POA: Diagnosis not present

## 2018-04-20 DIAGNOSIS — Z4801 Encounter for change or removal of surgical wound dressing: Secondary | ICD-10-CM | POA: Diagnosis not present

## 2018-04-23 DIAGNOSIS — Z48815 Encounter for surgical aftercare following surgery on the digestive system: Secondary | ICD-10-CM | POA: Diagnosis not present

## 2018-04-27 DIAGNOSIS — Z4803 Encounter for change or removal of drains: Secondary | ICD-10-CM | POA: Diagnosis not present

## 2018-04-27 DIAGNOSIS — Z452 Encounter for adjustment and management of vascular access device: Secondary | ICD-10-CM | POA: Diagnosis not present

## 2018-04-27 DIAGNOSIS — Z9689 Presence of other specified functional implants: Secondary | ICD-10-CM | POA: Diagnosis not present

## 2018-04-27 DIAGNOSIS — R8271 Bacteriuria: Secondary | ICD-10-CM | POA: Diagnosis not present

## 2018-04-27 DIAGNOSIS — Z432 Encounter for attention to ileostomy: Secondary | ICD-10-CM | POA: Diagnosis not present

## 2018-04-27 DIAGNOSIS — S72351D Displaced comminuted fracture of shaft of right femur, subsequent encounter for closed fracture with routine healing: Secondary | ICD-10-CM | POA: Diagnosis not present

## 2018-04-27 DIAGNOSIS — Z9049 Acquired absence of other specified parts of digestive tract: Secondary | ICD-10-CM | POA: Diagnosis not present

## 2018-04-27 DIAGNOSIS — K651 Peritoneal abscess: Secondary | ICD-10-CM | POA: Diagnosis not present

## 2018-04-27 DIAGNOSIS — Z48815 Encounter for surgical aftercare following surgery on the digestive system: Secondary | ICD-10-CM | POA: Diagnosis not present

## 2018-04-27 DIAGNOSIS — Z4801 Encounter for change or removal of surgical wound dressing: Secondary | ICD-10-CM | POA: Diagnosis not present

## 2018-05-01 DIAGNOSIS — L989 Disorder of the skin and subcutaneous tissue, unspecified: Secondary | ICD-10-CM | POA: Diagnosis not present

## 2018-05-01 DIAGNOSIS — Z932 Ileostomy status: Secondary | ICD-10-CM | POA: Diagnosis not present

## 2018-05-01 DIAGNOSIS — S72351A Displaced comminuted fracture of shaft of right femur, initial encounter for closed fracture: Secondary | ICD-10-CM | POA: Diagnosis not present

## 2018-05-01 DIAGNOSIS — S31109A Unspecified open wound of abdominal wall, unspecified quadrant without penetration into peritoneal cavity, initial encounter: Secondary | ICD-10-CM | POA: Diagnosis not present

## 2018-05-02 ENCOUNTER — Emergency Department (HOSPITAL_COMMUNITY)
Admission: EM | Admit: 2018-05-02 | Discharge: 2018-05-02 | Disposition: A | Discharge status: Home / Self Care | Payer: BLUE CROSS/BLUE SHIELD | Attending: Emergency Medicine | Admitting: Emergency Medicine

## 2018-05-02 ENCOUNTER — Other Ambulatory Visit: Payer: Self-pay

## 2018-05-02 DIAGNOSIS — R1084 Generalized abdominal pain: Secondary | ICD-10-CM | POA: Diagnosis not present

## 2018-05-02 DIAGNOSIS — S31109D Unspecified open wound of abdominal wall, unspecified quadrant without penetration into peritoneal cavity, subsequent encounter: Secondary | ICD-10-CM | POA: Insufficient documentation

## 2018-05-02 LAB — URINALYSIS, ROUTINE W REFLEX MICROSCOPIC
Bilirubin Urine: NEGATIVE
Glucose, UA: NEGATIVE mg/dL
Ketones, ur: NEGATIVE mg/dL
Leukocytes, UA: NEGATIVE
Nitrite: NEGATIVE
Protein, ur: 100 mg/dL — AB
RBC / HPF: >50 RBC/hpf — ABNORMAL HIGH (ref 0–5)
Specific Gravity, Urine: 1.032 — ABNORMAL HIGH (ref 1.005–1.030)
pH: 5 (ref 5.0–8.0)

## 2018-05-02 LAB — COMPREHENSIVE METABOLIC PANEL
ALBUMIN: 4 g/dL (ref 3.5–5.0)
ALT: 182 U/L — ABNORMAL HIGH (ref 0–44)
AST: 144 U/L — ABNORMAL HIGH (ref 15–41)
Alkaline Phosphatase: 469 U/L — ABNORMAL HIGH (ref 38–126)
Anion gap: 12 (ref 5–15)
BUN: 17 mg/dL (ref 6–20)
CO2: 23 mmol/L (ref 22–32)
Calcium: 9.6 mg/dL (ref 8.9–10.3)
Chloride: 104 mmol/L (ref 98–111)
Creatinine, Ser: 1.04 mg/dL (ref 0.61–1.24)
GFR calc Af Amer: >60 mL/min (ref >60)
GFR calc non Af Amer: >60 mL/min (ref >60)
GLUCOSE: 126 mg/dL — AB (ref 70–99)
Potassium: 4 mmol/L (ref 3.5–5.1)
Sodium: 139 mmol/L (ref 135–145)
Total Bilirubin: 1.8 mg/dL — ABNORMAL HIGH (ref 0.3–1.2)
Total Protein: 8 g/dL (ref 6.5–8.1)

## 2018-05-02 LAB — CBC
HCT: 40.3 % (ref 39.0–52.0)
Hemoglobin: 12.7 g/dL — ABNORMAL LOW (ref 13.0–17.0)
MCH: 26.7 pg (ref 26.0–34.0)
MCHC: 31.5 g/dL (ref 30.0–36.0)
MCV: 84.8 fL (ref 80.0–100.0)
Platelets: 442 10*3/uL — ABNORMAL HIGH (ref 150–400)
RBC: 4.75 MIL/uL (ref 4.22–5.81)
RDW: 14.4 % (ref 11.5–15.5)
WBC: 8.5 10*3/uL (ref 4.0–10.5)
nRBC: 0 % (ref 0.0–0.2)

## 2018-05-02 LAB — LIPASE, BLOOD: Lipase: 32 U/L (ref 11–51)

## 2018-05-02 MED ORDER — TRAMADOL HCL 50 MG PO TABS
50.0000 mg | ORAL_TABLET | Freq: Once | ORAL | Status: AC
  Administered 2018-05-02: 50 mg via ORAL
  Filled 2018-05-02: qty 1

## 2018-05-02 MED ORDER — TRAMADOL HCL 50 MG PO TABS: 50.0000 mg | ORAL_TABLET | Freq: Four times a day (QID) | ORAL | 0 refills | Status: AC | PRN

## 2018-05-02 MED ORDER — ONDANSETRON 4 MG PO TBDP
4.0000 mg | ORAL_TABLET | Freq: Once | ORAL | Status: AC
  Administered 2018-05-02: 4 mg via ORAL
  Filled 2018-05-02: qty 1

## 2018-05-02 MED ORDER — ONDANSETRON 4 MG PO TBDP: 4.0000 mg | ORAL_TABLET | Freq: Three times a day (TID) | ORAL | 0 refills | Status: AC | PRN

## 2018-05-02 NOTE — Discharge Instructions (Signed)

## 2018-05-02 NOTE — ED Notes (Signed)
ED Provider at bedside. 

## 2018-05-02 NOTE — ED Provider Notes (Signed)
Nursing staff reports the patient's pain has returned while he was waiting on ostomy bags.  Nursing staff reports that the weight on the bag may be a lengthy. He was given 50 mg of tramadol 2.5 hours ago.  He has a prescription for home tramadol.  [place] controlled substance database reviewed.  He has been taking tramadol at home for several days.  He has not been hypoxic since this morning.  Will give the patient another 50 of tramadol to increase to 100 mg at this time and observe him while he is waiting on ostomy bags.Marland Kitchen  He can then resume his 50 mg dosing after he is discharged.   Barkley Boards, PA-C 05/02/18 0957    Nira Conn, MD 05/02/18 2120

## 2018-05-02 NOTE — ED Notes (Signed)
Pt given discharge instructions. Pt verbalized understanding.
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
## 2018-05-02 NOTE — ED Notes (Signed)
Pt Needs [phone] Colostomy Bag ... Pudi [phone]

## 2018-05-02 NOTE — ED Triage Notes (Signed)
Patient c/o abdominal pain. Ileostomy was placed Oct. 30th. C/o stool changes (white poop), N/V, increased pain, and stool coming from his rectum AND his ileostomy.

## 2018-05-02 NOTE — ED Notes (Signed)
Pt requested stoma powder. Stoma powder has been ordered.

## 2018-05-07 DIAGNOSIS — Z432 Encounter for attention to ileostomy: Secondary | ICD-10-CM | POA: Diagnosis not present

## 2018-05-07 DIAGNOSIS — R8271 Bacteriuria: Secondary | ICD-10-CM | POA: Diagnosis not present

## 2018-05-07 DIAGNOSIS — Z932 Ileostomy status: Secondary | ICD-10-CM | POA: Diagnosis not present

## 2018-05-07 DIAGNOSIS — S3713XD Laceration of ureter, subsequent encounter: Secondary | ICD-10-CM | POA: Diagnosis not present

## 2018-05-07 DIAGNOSIS — T83192A Other mechanical complication of urinary stent, initial encounter: Secondary | ICD-10-CM | POA: Diagnosis not present

## 2018-05-08 ENCOUNTER — Other Ambulatory Visit: Payer: Self-pay | Admitting: Urology

## 2018-05-09 DIAGNOSIS — S72351D Displaced comminuted fracture of shaft of right femur, subsequent encounter for closed fracture with routine healing: Secondary | ICD-10-CM | POA: Diagnosis not present

## 2018-05-10 ENCOUNTER — Other Ambulatory Visit: Payer: Self-pay

## 2018-05-10 ENCOUNTER — Encounter (HOSPITAL_BASED_OUTPATIENT_CLINIC_OR_DEPARTMENT_OTHER): Payer: Self-pay | Admitting: *Deleted

## 2018-05-10 NOTE — Progress Notes (Signed)
Spoke with patient via telephone for pre op interview. NPO after MN. No medications AM of surgery. Recent labs, EKG and chest xray in chart and Epic. Arrival time 30.

## 2018-05-11 DIAGNOSIS — Z432 Encounter for attention to ileostomy: Secondary | ICD-10-CM | POA: Diagnosis not present

## 2018-05-11 DIAGNOSIS — Z4803 Encounter for change or removal of drains: Secondary | ICD-10-CM | POA: Diagnosis not present

## 2018-05-11 DIAGNOSIS — Z452 Encounter for adjustment and management of vascular access device: Secondary | ICD-10-CM | POA: Diagnosis not present

## 2018-05-11 DIAGNOSIS — S72351D Displaced comminuted fracture of shaft of right femur, subsequent encounter for closed fracture with routine healing: Secondary | ICD-10-CM | POA: Diagnosis not present

## 2018-05-11 DIAGNOSIS — Z48815 Encounter for surgical aftercare following surgery on the digestive system: Secondary | ICD-10-CM | POA: Diagnosis not present

## 2018-05-11 DIAGNOSIS — Z4801 Encounter for change or removal of surgical wound dressing: Secondary | ICD-10-CM | POA: Diagnosis not present

## 2018-05-11 DIAGNOSIS — Z9049 Acquired absence of other specified parts of digestive tract: Secondary | ICD-10-CM | POA: Diagnosis not present

## 2018-05-11 DIAGNOSIS — K651 Peritoneal abscess: Secondary | ICD-10-CM | POA: Diagnosis not present

## 2018-05-11 DIAGNOSIS — Z9689 Presence of other specified functional implants: Secondary | ICD-10-CM | POA: Diagnosis not present

## 2018-05-15 DIAGNOSIS — Z932 Ileostomy status: Secondary | ICD-10-CM | POA: Diagnosis not present

## 2018-05-15 DIAGNOSIS — Z432 Encounter for attention to ileostomy: Secondary | ICD-10-CM | POA: Diagnosis not present

## 2018-05-16 DIAGNOSIS — R262 Difficulty in walking, not elsewhere classified: Secondary | ICD-10-CM | POA: Diagnosis not present

## 2018-05-16 DIAGNOSIS — M79604 Pain in right leg: Secondary | ICD-10-CM | POA: Diagnosis not present

## 2018-05-16 DIAGNOSIS — Z4789 Encounter for other orthopedic aftercare: Secondary | ICD-10-CM | POA: Diagnosis not present

## 2018-05-22 ENCOUNTER — Other Ambulatory Visit: Payer: Self-pay

## 2018-05-22 ENCOUNTER — Emergency Department (HOSPITAL_COMMUNITY): Payer: BLUE CROSS/BLUE SHIELD

## 2018-05-22 ENCOUNTER — Observation Stay (HOSPITAL_COMMUNITY)
Admission: EM | Admit: 2018-05-22 | Discharge: 2018-05-24 | Disposition: A | Discharge status: Home / Self Care | Payer: BLUE CROSS/BLUE SHIELD | Attending: Internal Medicine | Admitting: Internal Medicine

## 2018-05-22 DIAGNOSIS — E860 Dehydration: Secondary | ICD-10-CM | POA: Insufficient documentation

## 2018-05-22 DIAGNOSIS — Y732 Prosthetic and other implants, materials and accessory gastroenterology and urology devices associated with adverse incidents: Secondary | ICD-10-CM | POA: Insufficient documentation

## 2018-05-22 DIAGNOSIS — E871 Hypo-osmolality and hyponatremia: Secondary | ICD-10-CM | POA: Diagnosis not present

## 2018-05-22 DIAGNOSIS — N179 Acute kidney failure, unspecified: Secondary | ICD-10-CM | POA: Insufficient documentation

## 2018-05-22 DIAGNOSIS — Z932 Ileostomy status: Secondary | ICD-10-CM | POA: Insufficient documentation

## 2018-05-22 DIAGNOSIS — T839XXA Unspecified complication of genitourinary prosthetic device, implant and graft, initial encounter: Secondary | ICD-10-CM | POA: Diagnosis not present

## 2018-05-22 DIAGNOSIS — R42 Dizziness and giddiness: Secondary | ICD-10-CM | POA: Diagnosis not present

## 2018-05-22 DIAGNOSIS — Z79899 Other long term (current) drug therapy: Secondary | ICD-10-CM | POA: Insufficient documentation

## 2018-05-22 DIAGNOSIS — R638 Other symptoms and signs concerning food and fluid intake: Secondary | ICD-10-CM

## 2018-05-22 DIAGNOSIS — I951 Orthostatic hypotension: Secondary | ICD-10-CM | POA: Insufficient documentation

## 2018-05-22 DIAGNOSIS — R000 Tachycardia, unspecified: Secondary | ICD-10-CM | POA: Diagnosis not present

## 2018-05-22 DIAGNOSIS — R0602 Shortness of breath: Secondary | ICD-10-CM | POA: Diagnosis not present
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
## 2018-05-23 ENCOUNTER — Encounter (HOSPITAL_COMMUNITY): Payer: Self-pay | Admitting: Internal Medicine

## 2018-05-23 DIAGNOSIS — Z932 Ileostomy status: Secondary | ICD-10-CM | POA: Diagnosis not present

## 2018-05-23 DIAGNOSIS — Y732 Prosthetic and other implants, materials and accessory gastroenterology and urology devices associated with adverse incidents: Secondary | ICD-10-CM | POA: Diagnosis not present

## 2018-05-23 DIAGNOSIS — E871 Hypo-osmolality and hyponatremia: Secondary | ICD-10-CM | POA: Diagnosis not present

## 2018-05-23 DIAGNOSIS — Z79899 Other long term (current) drug therapy: Secondary | ICD-10-CM | POA: Diagnosis not present

## 2018-05-23 DIAGNOSIS — E860 Dehydration: Secondary | ICD-10-CM | POA: Diagnosis not present

## 2018-05-23 DIAGNOSIS — T839XXA Unspecified complication of genitourinary prosthetic device, implant and graft, initial encounter: Secondary | ICD-10-CM | POA: Diagnosis not present

## 2018-05-23 DIAGNOSIS — I951 Orthostatic hypotension: Secondary | ICD-10-CM | POA: Diagnosis present

## 2018-05-23 DIAGNOSIS — N179 Acute kidney failure, unspecified: Secondary | ICD-10-CM | POA: Diagnosis not present

## 2018-05-23 DIAGNOSIS — R638 Other symptoms and signs concerning food and fluid intake: Secondary | ICD-10-CM | POA: Diagnosis not present

## 2018-05-23 LAB — LIPASE, BLOOD: Lipase: 43 U/L (ref 11–51)

## 2018-05-23 LAB — COMPREHENSIVE METABOLIC PANEL
ALBUMIN: 4.8 g/dL (ref 3.5–5.0)
ALT: 41 U/L (ref 0–44)
AST: 24 U/L (ref 15–41)
Alkaline Phosphatase: 257 U/L — ABNORMAL HIGH (ref 38–126)
Anion gap: 14 (ref 5–15)
BUN: 20 mg/dL (ref 6–20)
CALCIUM: 9.8 mg/dL (ref 8.9–10.3)
CO2: 22 mmol/L (ref 22–32)
Chloride: 95 mmol/L — ABNORMAL LOW (ref 98–111)
Creatinine, Ser: 1.34 mg/dL — ABNORMAL HIGH (ref 0.61–1.24)
GFR calc Af Amer: >60 mL/min (ref >60)
GFR calc non Af Amer: >60 mL/min (ref >60)
Glucose, Bld: 106 mg/dL — ABNORMAL HIGH (ref 70–99)
Potassium: 3.7 mmol/L (ref 3.5–5.1)
Sodium: 131 mmol/L — ABNORMAL LOW (ref 135–145)
Total Bilirubin: 0.8 mg/dL (ref 0.3–1.2)
Total Protein: 8.8 g/dL — ABNORMAL HIGH (ref 6.5–8.1)

## 2018-05-23 LAB — URINALYSIS, ROUTINE W REFLEX MICROSCOPIC
Glucose, UA: NEGATIVE mg/dL
Ketones, ur: NEGATIVE mg/dL
Nitrite: NEGATIVE
Protein, ur: 100 mg/dL — AB
RBC / HPF: >50 RBC/hpf — ABNORMAL HIGH (ref 0–5)
SPECIFIC GRAVITY, URINE: 1.03 (ref 1.005–1.030)
pH: 5 (ref 5.0–8.0)

## 2018-05-23 LAB — CBC
HCT: 47.7 % (ref 39.0–52.0)
Hemoglobin: 15.7 g/dL (ref 13.0–17.0)
MCH: 26.5 pg (ref 26.0–34.0)
MCHC: 32.9 g/dL (ref 30.0–36.0)
MCV: 80.6 fL (ref 80.0–100.0)
Platelets: 448 10*3/uL — ABNORMAL HIGH (ref 150–400)
RBC: 5.92 MIL/uL — AB (ref 4.22–5.81)
RDW: 14.1 % (ref 11.5–15.5)
WBC: 7.4 10*3/uL (ref 4.0–10.5)
nRBC: 0 % (ref 0.0–0.2)

## 2018-05-23 MED ORDER — ACETAMINOPHEN 325 MG PO TABS
650.0000 mg | ORAL_TABLET | Freq: Four times a day (QID) | ORAL | Status: DC | PRN
  Filled 2018-05-23: qty 2

## 2018-05-23 MED ORDER — MUPIROCIN 2 % EX OINT
1.0000 "application " | TOPICAL_OINTMENT | Freq: Two times a day (BID) | CUTANEOUS | Status: DC
  Administered 2018-05-23: 1 via NASAL
  Filled 2018-05-23: qty 22

## 2018-05-23 MED ORDER — SODIUM CHLORIDE 0.9 % IV BOLUS
1000.0000 mL | Freq: Once | INTRAVENOUS | Status: AC
  Administered 2018-05-23: 1000 mL via INTRAVENOUS

## 2018-05-23 MED ORDER — SODIUM CHLORIDE 0.9 % IV SOLN
INTRAVENOUS | Status: DC
  Administered 2018-05-23 (×3): via INTRAVENOUS
  Filled 2018-05-23: qty 1000

## 2018-05-23 MED ORDER — SODIUM CHLORIDE 0.9 % IV BOLUS
500.0000 mL | Freq: Once | INTRAVENOUS | Status: AC
  Administered 2018-05-23: 500 mL via INTRAVENOUS

## 2018-05-23 MED ORDER — METHOCARBAMOL 750 MG PO TABS
750.0000 mg | ORAL_TABLET | Freq: Four times a day (QID) | ORAL | Status: DC | PRN
  Filled 2018-05-23: qty 1

## 2018-05-23 MED ORDER — TRAMADOL HCL 50 MG PO TABS
50.0000 mg | ORAL_TABLET | Freq: Four times a day (QID) | ORAL | Status: DC | PRN
  Administered 2018-05-23: 50 mg via ORAL
  Filled 2018-05-23 (×2): qty 1

## 2018-05-23 MED ORDER — KETOROLAC TROMETHAMINE 15 MG/ML IJ SOLN
15.0000 mg | Freq: Once | INTRAMUSCULAR | Status: AC
  Administered 2018-05-23: 15 mg via INTRAVENOUS
  Filled 2018-05-23: qty 1

## 2018-05-23 MED ORDER — ACETAMINOPHEN 650 MG RE SUPP
650.0000 mg | Freq: Four times a day (QID) | RECTAL | Status: DC | PRN
  Filled 2018-05-23: qty 1

## 2018-05-23 MED ORDER — CIPROFLOXACIN IN D5W 400 MG/200ML IV SOLN
400.0000 mg | Freq: Once | INTRAVENOUS | Status: AC
  Administered 2018-05-24: 400 mg via INTRAVENOUS
  Filled 2018-05-23: qty 200
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
## 2018-05-23 NOTE — ED Notes (Signed)
Patient c/o dizziness when standing
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
## 2018-05-24 ENCOUNTER — Other Ambulatory Visit: Payer: Self-pay

## 2018-05-24 ENCOUNTER — Observation Stay (HOSPITAL_BASED_OUTPATIENT_CLINIC_OR_DEPARTMENT_OTHER): Payer: BLUE CROSS/BLUE SHIELD | Admitting: Anesthesiology

## 2018-05-24 ENCOUNTER — Encounter (HOSPITAL_BASED_OUTPATIENT_CLINIC_OR_DEPARTMENT_OTHER): Payer: Self-pay | Admitting: Emergency Medicine

## 2018-05-24 ENCOUNTER — Encounter (HOSPITAL_BASED_OUTPATIENT_CLINIC_OR_DEPARTMENT_OTHER)
Admission: EM | Disposition: A | Discharge status: Home / Self Care | Payer: Self-pay | Source: Home / Self Care | Attending: Emergency Medicine

## 2018-05-24 ENCOUNTER — Ambulatory Visit (HOSPITAL_BASED_OUTPATIENT_CLINIC_OR_DEPARTMENT_OTHER): Admission: RE | Admit: 2018-05-24 | Payer: BLUE CROSS/BLUE SHIELD | Source: Home / Self Care | Admitting: Urology

## 2018-05-24 DIAGNOSIS — T191XXA Foreign body in bladder, initial encounter: Secondary | ICD-10-CM | POA: Diagnosis not present

## 2018-05-24 DIAGNOSIS — Z466 Encounter for fitting and adjustment of urinary device: Secondary | ICD-10-CM | POA: Diagnosis not present

## 2018-05-24 DIAGNOSIS — R638 Other symptoms and signs concerning food and fluid intake: Secondary | ICD-10-CM | POA: Diagnosis not present

## 2018-05-24 HISTORY — PX: CYSTOSCOPY WITH URETEROSCOPY AND STENT PLACEMENT: SHX6377

## 2018-05-24 LAB — CBC
HCT: 34.6 % — ABNORMAL LOW (ref 39.0–52.0)
Hemoglobin: 10.9 g/dL — ABNORMAL LOW (ref 13.0–17.0)
MCH: 27.2 pg (ref 26.0–34.0)
MCHC: 31.5 g/dL (ref 30.0–36.0)
MCV: 86.3 fL (ref 80.0–100.0)
Platelets: 233 10*3/uL (ref 150–400)
RBC: 4.01 MIL/uL — ABNORMAL LOW (ref 4.22–5.81)
RDW: 14.3 % (ref 11.5–15.5)
WBC: 6.3 10*3/uL (ref 4.0–10.5)
nRBC: 0 % (ref 0.0–0.2)

## 2018-05-24 LAB — BASIC METABOLIC PANEL
Anion gap: 5 (ref 5–15)
BUN: 14 mg/dL (ref 6–20)
CO2: 22 mmol/L (ref 22–32)
Calcium: 8.4 mg/dL — ABNORMAL LOW (ref 8.9–10.3)
Chloride: 111 mmol/L (ref 98–111)
Creatinine, Ser: 0.75 mg/dL (ref 0.61–1.24)
GFR calc Af Amer: >60 mL/min (ref >60)
GFR calc non Af Amer: >60 mL/min (ref >60)
Glucose, Bld: 95 mg/dL (ref 70–99)
Potassium: 4 mmol/L (ref 3.5–5.1)
Sodium: 138 mmol/L (ref 135–145)

## 2018-05-24 SURGERY — CYSTOURETEROSCOPY, WITH STENT INSERTION
Anesthesia: General | Site: Urethra | Laterality: Right

## 2018-05-24 MED ORDER — FENTANYL CITRATE (PF) 100 MCG/2ML IJ SOLN
25.0000 ug | INTRAMUSCULAR | Status: DC | PRN
  Administered 2018-05-24: 25 ug via INTRAVENOUS
  Administered 2018-05-24: 50 ug via INTRAVENOUS
  Filled 2018-05-24: qty 1

## 2018-05-24 MED ORDER — PROMETHAZINE HCL 25 MG/ML IJ SOLN
6.2500 mg | INTRAMUSCULAR | Status: DC | PRN
  Filled 2018-05-24: qty 1

## 2018-05-24 MED ORDER — DEXAMETHASONE SODIUM PHOSPHATE 10 MG/ML IJ SOLN
INTRAMUSCULAR | Status: DC | PRN
  Administered 2018-05-24: 5 mg via INTRAVENOUS

## 2018-05-24 MED ORDER — LACTATED RINGERS IV SOLN
INTRAVENOUS | Status: DC
  Administered 2018-05-24 (×2): via INTRAVENOUS
  Filled 2018-05-24: qty 1000

## 2018-05-24 MED ORDER — SODIUM CHLORIDE 0.9% FLUSH
3.0000 mL | Freq: Two times a day (BID) | INTRAVENOUS | Status: DC
  Filled 2018-05-24: qty 3

## 2018-05-24 MED ORDER — CIPROFLOXACIN IN D5W 400 MG/200ML IV SOLN
400.0000 mg | INTRAVENOUS | Status: AC
  Administered 2018-05-24 (×2): 400 mg via INTRAVENOUS
  Filled 2018-05-24: qty 200

## 2018-05-24 MED ORDER — MIDAZOLAM HCL 2 MG/2ML IJ SOLN
INTRAMUSCULAR | Status: AC
  Filled 2018-05-24: qty 2

## 2018-05-24 MED ORDER — FENTANYL CITRATE (PF) 100 MCG/2ML IJ SOLN
INTRAMUSCULAR | Status: AC
  Filled 2018-05-24: qty 2

## 2018-05-24 MED ORDER — CIPROFLOXACIN HCL 500 MG PO TABS: 500.0000 mg | ORAL_TABLET | Freq: Two times a day (BID) | ORAL | 0 refills | Status: DC

## 2018-05-24 MED ORDER — PROPOFOL 10 MG/ML IV BOLUS
INTRAVENOUS | Status: AC
  Filled 2018-05-24: qty 40

## 2018-05-24 MED ORDER — CIPROFLOXACIN IN D5W 400 MG/200ML IV SOLN
INTRAVENOUS | Status: AC
  Filled 2018-05-24: qty 200

## 2018-05-24 MED ORDER — IOHEXOL 300 MG/ML  SOLN
INTRAMUSCULAR | Status: DC | PRN
  Administered 2018-05-24: 50 mL

## 2018-05-24 MED ORDER — ACETAMINOPHEN 325 MG PO TABS
650.0000 mg | ORAL_TABLET | ORAL | Status: DC | PRN
  Filled 2018-05-24: qty 2

## 2018-05-24 MED ORDER — ACETAMINOPHEN 650 MG RE SUPP
650.0000 mg | RECTAL | Status: DC | PRN
  Filled 2018-05-24: qty 1

## 2018-05-24 MED ORDER — ONDANSETRON HCL 4 MG/2ML IJ SOLN
INTRAMUSCULAR | Status: DC | PRN
  Administered 2018-05-24: 4 mg via INTRAVENOUS

## 2018-05-24 MED ORDER — SODIUM CHLORIDE 0.9% FLUSH
3.0000 mL | INTRAVENOUS | Status: DC | PRN
  Filled 2018-05-24: qty 3

## 2018-05-24 MED ORDER — MIDAZOLAM HCL 2 MG/2ML IJ SOLN
INTRAMUSCULAR | Status: DC | PRN
  Administered 2018-05-24: 1 mg via INTRAVENOUS

## 2018-05-24 MED ORDER — PROPOFOL 10 MG/ML IV BOLUS
INTRAVENOUS | Status: AC
  Filled 2018-05-24: qty 20

## 2018-05-24 MED ORDER — OXYCODONE HCL 5 MG PO TABS
5.0000 mg | ORAL_TABLET | ORAL | Status: DC | PRN
  Filled 2018-05-24: qty 2

## 2018-05-24 MED ORDER — SODIUM CHLORIDE 0.9 % IV SOLN
250.0000 mL | INTRAVENOUS | Status: DC | PRN
  Filled 2018-05-24: qty 250

## 2018-05-24 MED ORDER — LIDOCAINE 2% (20 MG/ML) 5 ML SYRINGE
INTRAMUSCULAR | Status: DC | PRN
  Administered 2018-05-24: 80 mg via INTRAVENOUS

## 2018-05-24 MED ORDER — SODIUM CHLORIDE 0.9 % IR SOLN
Status: DC | PRN
  Administered 2018-05-24: 3000 mL

## 2018-05-24 MED ORDER — KETOROLAC TROMETHAMINE 30 MG/ML IJ SOLN
INTRAMUSCULAR | Status: DC | PRN
  Administered 2018-05-24: 30 mg via INTRAVENOUS

## 2018-05-24 MED ORDER — PROPOFOL 10 MG/ML IV BOLUS
INTRAVENOUS | Status: DC | PRN
  Administered 2018-05-24: 100 mg via INTRAVENOUS
  Administered 2018-05-24: 50 mg via INTRAVENOUS
  Administered 2018-05-24: 150 mg via INTRAVENOUS

## 2018-05-24 MED ORDER — FENTANYL CITRATE (PF) 100 MCG/2ML IJ SOLN
INTRAMUSCULAR | Status: DC | PRN
  Administered 2018-05-24: 50 ug via INTRAVENOUS

## 2018-05-24 MED ORDER — MORPHINE SULFATE (PF) 2 MG/ML IV SOLN
2.0000 mg | INTRAVENOUS | Status: DC | PRN
  Filled 2018-05-24: qty 1

## 2018-05-24 SURGICAL SUPPLY — 29 items
BAG DRAIN URO-CYSTO SKYTR STRL (DRAIN) ×3 IMPLANT
BASKET STONE 1.7 NGAGE (UROLOGICAL SUPPLIES) IMPLANT
BASKET ZERO TIP NITINOL 2.4FR (BASKET) IMPLANT
CATH FOLEY 2WAY SLVR  5CC 18FR (CATHETERS) ×2
CATH FOLEY 2WAY SLVR 5CC 18FR (CATHETERS) ×1 IMPLANT
CATH URET 5FR 28IN CONE TIP (BALLOONS)
CATH URET 5FR 28IN OPEN ENDED (CATHETERS) ×3 IMPLANT
CATH URET 5FR 70CM CONE TIP (BALLOONS) IMPLANT
CLOTH BEACON ORANGE TIMEOUT ST (SAFETY) ×3 IMPLANT
ELECT REM PT RETURN 9FT ADLT (ELECTROSURGICAL)
ELECTRODE REM PT RTRN 9FT ADLT (ELECTROSURGICAL) IMPLANT
FIBER LASER FLEXIVA 365 (UROLOGICAL SUPPLIES) IMPLANT
FIBER LASER TRAC TIP (UROLOGICAL SUPPLIES) IMPLANT
GLOVE BIOGEL PI IND STRL 6.5 (GLOVE) ×1 IMPLANT
GLOVE BIOGEL PI INDICATOR 6.5 (GLOVE) ×2
GLOVE INDICATOR 6.5 STRL GRN (GLOVE) ×3 IMPLANT
GLOVE SURG SS PI 8.0 STRL IVOR (GLOVE) ×3 IMPLANT
GOWN STRL REUS W/TWL LRG LVL3 (GOWN DISPOSABLE) ×3 IMPLANT
GOWN STRL REUS W/TWL XL LVL3 (GOWN DISPOSABLE) ×3 IMPLANT
GUIDEWIRE ANG ZIPWIRE 038X150 (WIRE) IMPLANT
GUIDEWIRE STR DUAL SENSOR (WIRE) ×3 IMPLANT
IV NS IRRIG 3000ML ARTHROMATIC (IV SOLUTION) ×3 IMPLANT
KIT TURNOVER CYSTO (KITS) ×3 IMPLANT
MANIFOLD NEPTUNE II (INSTRUMENTS) ×3 IMPLANT
NS IRRIG 500ML POUR BTL (IV SOLUTION) ×3 IMPLANT
PACK CYSTO (CUSTOM PROCEDURE TRAY) ×3 IMPLANT
TUBE CONNECTING 12'X1/4 (SUCTIONS) ×1
TUBE CONNECTING 12X1/4 (SUCTIONS) ×2 IMPLANT
TUBING UROLOGY SET (TUBING) ×3 IMPLANT

## 2018-05-24 NOTE — Progress Notes (Signed)
Subjective: Richard [name] is doing better with IVF.  He is without complaints.  His Cr has normalized.  His Hgb dropped markedly with hydration but that is most consistent with a dilutional effect and puts him back at his baseline.  ROS:  Review of Systems  All other systems reviewed and are negative.   Anti-infectives: Anti-infectives (From admission, onward)   Start     Dose/Rate Route Frequency Ordered Stop   05/24/18 0800  ciprofloxacin (CIPRO) IVPB 400 mg     400 mg 200 mL/hr over 60 Minutes Intravenous  Once 05/23/18 1721        Current Facility-Administered Medications  Medication Dose Route Frequency Provider Last Rate Last Dose  . 0.9 %  sodium chloride infusion   Intravenous Continuous Purohit, Salli QuarryShrey C, MD 150 mL/hr at 05/23/18 2233    . acetaminophen (TYLENOL) tablet 650 mg  650 mg Oral Q6H PRN Dorcas CarrowGhimire, Kuber, MD       Or  . acetaminophen (TYLENOL) suppository 650 mg  650 mg Rectal Q6H PRN Dorcas CarrowGhimire, Kuber, MD      . ciprofloxacin (CIPRO) IVPB 400 mg  400 mg Intravenous Once Bjorn PippinWrenn, Giulia Hickey, MD      . methocarbamol (ROBAXIN) tablet 750 mg  750 mg Oral Q6H PRN Dorcas CarrowGhimire, Kuber, MD      . mupirocin ointment (BACTROBAN) 2 % 1 application  1 application Nasal BID Purohit, Salli QuarryShrey C, MD   1 application at 05/23/18 2232  . traMADol (ULTRAM) tablet 50 mg  50 mg Oral Q6H PRN Dorcas CarrowGhimire, Kuber, MD   50 mg at 05/23/18 1557     Objective: Vital signs in last 24 hours: Temp:  [97.7 F (36.5 C)-97.9 F (36.6 C)] 97.9 F (36.6 C) (01/22 2145) Pulse Rate:  [70-96] 70 (01/23 0545) Resp:  [16-20] 17 (01/23 0545) BP: (104-124)/(55-72) 109/61 (01/23 0545) SpO2:  [99 %-100 %] 100 % (01/23 0545) Weight:  [68 kg] 68 kg (01/22 1117)  Intake/Output from previous day: 01/22 0701 - 01/23 0700 In: 2035.8 [P.O.:240; I.V.:795.8; IV Piggyback:1000] Out: 1675 [Stool:1675] Intake/Output this shift: No intake/output data recorded.   Physical Exam Vitals signs reviewed.  Constitutional:      Appearance:  He is well-developed.  Neurological:     Mental Status: He is alert.     Lab Results:  Recent Labs    05/22/18 2343 05/24/18 0418  WBC 7.4 6.3  HGB 15.7 10.9*  HCT 47.7 34.6*  PLT 448* 233   BMET Recent Labs    05/22/18 2343 05/24/18 0418  NA 131* 138  K 3.7 4.0  CL 95* 111  CO2 22 22  GLUCOSE 106* 95  BUN 20 14  CREATININE 1.34* 0.75  CALCIUM 9.8 8.4*   PT/INR No results for input(s): LABPROT, INR in the last 72 hours. ABG No results for input(s): PHART, HCO3 in the last 72 hours.  Invalid input(s): PCO2, PO2  Studies/Results: Dg Chest 2 View  Result Date: 05/22/2018 CLINICAL DATA:  Shortness of breath EXAM: CHEST - 2 VIEW COMPARISON:  04/18/2018 FINDINGS: No focal opacity or pleural effusion. Normal cardiomediastinal silhouette. No pneumothorax. Previous left-sided rib fractures. IMPRESSION: No active cardiopulmonary disease. Electronically Signed   By: Jasmine PangKim  Fujinaga M.D.   On: 05/22/2018 23:44     Assessment and Plan: Dehydration with AKI imrpoved with hydration.  Retained right ureteral stent.   I have asked that he be discharged so that [facility] can [facility] schedule stent removal.  LOS: 0 days    Bjorn Pippin 05/24/2018 [phone] ID: Richard E Janosik Jr., male   DOB: [birth year], 23 y.o.   MRN: [id number]

## 2018-05-24 NOTE — Transfer of Care (Signed)
Immediate Anesthesia Transfer of Care Note  Patient: Richard E Hipolito Jr.  Procedure(s) Performed: CYSTOSCOPY/RIGHT STENT REMOVAL /CYSTOGRAM (Right Urethra)  Patient Location: PACU  Anesthesia Type:General  Level of Consciousness: sedated and patient cooperative  Airway & Oxygen Therapy: Patient Spontanous Breathing and Patient connected to nasal cannula oxygen  Post-op Assessment: Report given to RN and Post -op Vital signs reviewed and stable  Post vital signs: Reviewed and stable  Last Vitals:  Vitals Value Taken Time  BP 107/63 05/24/2018 10:43 AM  Temp    Pulse 86 05/24/2018 10:44 AM  Resp 14 05/24/2018 10:44 AM  SpO2 100 % 05/24/2018 10:44 AM  Vitals shown include unvalidated device data.  Last Pain:  Vitals:   05/24/18 0835  TempSrc: Oral  PainSc:          Complications: No apparent anesthesia complications

## 2018-05-24 NOTE — Discharge Instructions (Addendum)
Post Anesthesia Home Care Instructions  Activity: Get plenty of rest for the remainder of the day. A responsible individual must stay with you for 24 hours following the procedure.  For the next 24 hours, DO NOT: -Drive a car -Advertising copywriter -Drink alcoholic beverages -Take any medication unless instructed by your physician -Make any legal decisions or sign important papers.  Meals: Start with liquid foods such as gelatin or soup. Progress to regular foods as tolerated. Avoid greasy, spicy, heavy foods. If nausea and/or vomiting occur, drink only clear liquids until the nausea and/or vomiting subsides. Call your physician if vomiting continues.  Special Instructions/Symptoms: Your throat may feel dry or sore from the anesthesia or the breathing tube placed in your throat during surgery. If this causes discomfort, gargle with warm salt water. The discomfort should disappear within 24 hours.  CYSTOSCOPY HOME CARE INSTRUCTIONS  Activity: Rest for the remainder of the day.  Do not drive or operate equipment today.  You may resume normal activities in one to two days as instructed by your physician.   Meals: Drink plenty of liquids and eat light foods such as gelatin or soup this evening.  You may return to a normal meal plan tomorrow.  Return to Work: You may return to work in one to two days or as instructed by your physician.  Special Instructions / Symptoms: Call your physician if any of these symptoms occur:   -persistent or heavy bleeding  -bleeding which continues after first few urination  -large blood clots that are difficult to pass  -urine stream diminishes or stops completely  -fever equal to or higher than 101 degrees Farenheit.  -cloudy urine with a strong, foul odor  -severe pain  Females should always wipe from front to back after elimination.  You may feel some burning pain when you urinate.  This should disappear with time.  Applying moist heat to the lower  abdomen or a hot tub bath may help relieve the pain. \    Patient Signature:  ________________________________________________________  Nurse's Signature:  ________________________________________________________ Near-Syncope Near-syncope is when you suddenly become weak or dizzy, or you feel like you might pass out (faint). During an episode of near-syncope, you may:  Feel dizzy or light-headed.  Feel nauseous.  See all white or all black in your field of vision.  Have cold, clammy skin. This condition is caused by a sudden decrease in blood flow to the brain. This decrease can result from various causes, but most of those causes are not dangerous. However, near-syncope can be a sign of a serious medical problem, so it is important to seek medical care. If you fainted, get medical help right away.Call your local emergency services (911 in the [place] Do not drive yourself to the hospital. Follow these instructions at home: Pay attention to any changes in your symptoms. Take these actions to help with your condition:  Have someone stay with you until you feel stable.  Do not drive, use machinery, or play sports until your health care provider says it is okay.  Keep all follow-up visits as told by your health care provider. This is important.  If you start to feel like you might faint, lie down right away and raise (elevate) your feet above the level of your heart. Breathe deeply and steadily. Wait until all of the symptoms have passed.  Drink enough fluid to keep your urine clear or pale yellow.  If you are taking blood pressure or heart medicine, get  up slowly and take several minutes to sit and then stand. This can reduce dizziness.  Take over-the-counter and prescription medicines only as told by your health care provider. Get help right away if:  You have a severe headache.  You have unusual pain in your chest, abdomen, or back.  You are bleeding from your mouth or rectum,  or you have black or tarry stool.  You have a very fast or irregular heartbeat (palpitations).  You faint once or repeatedly.  You have a seizure.  You are confused.  You have trouble walking.  You have severe weakness.  You have vision problems. These symptoms may represent a serious problem that is an emergency. Do not wait to see if your symptoms will go away. Get medical help right away. Call your local emergency services (911 in the [place] Do not drive yourself to the hospital. This information is not intended to replace advice given to you by your health care provider. Make sure you discuss any questions you have with your health care provider. Document Released: 04/18/2005 Document Revised: 05/31/2016 Document Reviewed: 12/31/2014 Elsevier Interactive Patient Education  2019 [employer].
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
## 2018-05-24 NOTE — Anesthesia Procedure Notes (Signed)
Procedure Name: LMA Insertion Date/Time: 05/24/2018 10:04 AM Performed by: Tyrone Nine, CRNA Pre-anesthesia Checklist: Patient identified, Emergency Drugs available, Suction available and Patient being monitored Patient Re-evaluated:Patient Re-evaluated prior to induction Oxygen Delivery Method: Circle system utilized Preoxygenation: Pre-oxygenation with 100% oxygen Induction Type: IV induction Ventilation: Mask ventilation without difficulty LMA: LMA inserted LMA Size: 4.0 Number of attempts: 1 Airway Equipment and Method: Bite block Placement Confirmation: breath sounds checked- equal and bilateral,  CO2 detector and positive ETCO2 Tube secured with: Tape Dental Injury: Teeth and Oropharynx as per pre-operative assessment

## 2018-05-24 NOTE — Anesthesia Preprocedure Evaluation (Signed)
Anesthesia Evaluation  Patient identified by MRN, date of birth, ID band Patient awake    Reviewed: Allergy & Precautions, NPO status , Patient's Chart, lab work & pertinent test results  Airway Mallampati: II  TM Distance: >3 FB Neck ROM: Full    Dental  (+) Teeth Intact, Dental Advisory Given   Pulmonary neg pulmonary ROS,    Pulmonary exam normal breath sounds clear to auscultation       Cardiovascular negative cardio ROS Normal cardiovascular exam Rhythm:Regular Rate:Normal     Neuro/Psych negative neurological ROS     GI/Hepatic negative GI ROS, Neg liver ROS, S/p ileostomy    Endo/Other  negative endocrine ROS  Renal/GU negative Renal ROSRETAINED RIGHT STENT     Musculoskeletal negative musculoskeletal ROS (+)   Abdominal   Peds  Hematology  (+) Blood dyscrasia, anemia ,   Anesthesia Other Findings Day of surgery medications reviewed with the patient.  Reproductive/Obstetrics                             Anesthesia Physical Anesthesia Plan  ASA: II  Anesthesia Plan: General   Post-op Pain Management:    Induction: Intravenous  PONV Risk Score and Plan: 3 and Midazolam, Dexamethasone and Ondansetron  Airway Management Planned: LMA  Additional Equipment:   Intra-op Plan:   Post-operative Plan: Extubation in OR  Informed Consent: I have reviewed the patients History and Physical, chart, labs and discussed the procedure including the risks, benefits and alternatives for the proposed anesthesia with the patient or authorized representative who has indicated his/her understanding and acceptance.     Dental advisory given  Plan Discussed with: CRNA  Anesthesia Plan Comments: (Risks/benefits of general anesthesia discussed with patient including risk of damage to teeth, lips, gum, and tongue, nausea/vomiting, allergic reactions to medications, and the possibility of heart  attack, stroke and death.  All patient questions answered.  Patient wishes to proceed.)        Anesthesia Quick Evaluation
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
## 2018-05-24 NOTE — OR Nursing (Signed)
Right stent removed by Dr. Annabell Howells

## 2018-05-25 ENCOUNTER — Encounter (HOSPITAL_BASED_OUTPATIENT_CLINIC_OR_DEPARTMENT_OTHER): Payer: Self-pay | Admitting: Urology

## 2018-05-25 NOTE — Anesthesia Postprocedure Evaluation (Signed)
Anesthesia Post Note  Patient: Richard E Brooke Jr.  Procedure(s) Performed: CYSTOSCOPY/RIGHT STENT REMOVAL /CYSTOGRAM (Right Urethra)     Patient location during evaluation: PACU Anesthesia Type: General Level of consciousness: awake and alert Pain management: pain level controlled Vital Signs Assessment: post-procedure vital signs reviewed and stable Respiratory status: spontaneous breathing, nonlabored ventilation, respiratory function stable and patient connected to nasal cannula oxygen Cardiovascular status: blood pressure returned to baseline and stable Postop Assessment: no apparent nausea or vomiting Anesthetic complications: no    Last Vitals:  Vitals:   05/24/18 1130 05/24/18 1230  BP: (!) 106/58 120/61  Pulse: 80 79  Resp: 17 16  Temp:  36.8 C  SpO2: 100% 100%    Last Pain:  Vitals:   05/24/18 1230  TempSrc:   PainSc: 0-No pain                 Cecile Hearing

## 2018-05-26 LAB — NASOPHARYNGEAL CULTURE

## 2018-05-30 DIAGNOSIS — Z4789 Encounter for other orthopedic aftercare: Secondary | ICD-10-CM | POA: Diagnosis not present

## 2018-05-30 DIAGNOSIS — M79604 Pain in right leg: Secondary | ICD-10-CM | POA: Diagnosis not present

## 2018-05-30 DIAGNOSIS — R262 Difficulty in walking, not elsewhere classified: Secondary | ICD-10-CM | POA: Diagnosis not present

## 2018-06-05 DIAGNOSIS — S3713XD Laceration of ureter, subsequent encounter: Secondary | ICD-10-CM | POA: Diagnosis not present

## 2018-06-06 DIAGNOSIS — R29818 Other symptoms and signs involving the nervous system: Secondary | ICD-10-CM | POA: Diagnosis not present

## 2018-06-06 DIAGNOSIS — Z4789 Encounter for other orthopedic aftercare: Secondary | ICD-10-CM | POA: Diagnosis not present

## 2018-06-06 DIAGNOSIS — M79604 Pain in right leg: Secondary | ICD-10-CM | POA: Diagnosis not present

## 2018-06-06 DIAGNOSIS — R262 Difficulty in walking, not elsewhere classified: Secondary | ICD-10-CM | POA: Diagnosis not present

## 2018-06-06 DIAGNOSIS — D649 Anemia, unspecified: Secondary | ICD-10-CM | POA: Diagnosis not present

## 2018-06-06 DIAGNOSIS — Z932 Ileostomy status: Secondary | ICD-10-CM | POA: Diagnosis not present

## 2018-06-08 DIAGNOSIS — R262 Difficulty in walking, not elsewhere classified: Secondary | ICD-10-CM | POA: Diagnosis not present

## 2018-06-08 DIAGNOSIS — M79604 Pain in right leg: Secondary | ICD-10-CM | POA: Diagnosis not present

## 2018-06-08 DIAGNOSIS — Z4789 Encounter for other orthopedic aftercare: Secondary | ICD-10-CM | POA: Diagnosis not present

## 2018-06-12 DIAGNOSIS — G4733 Obstructive sleep apnea (adult) (pediatric): Secondary | ICD-10-CM | POA: Diagnosis not present

## 2018-06-12 DIAGNOSIS — R0602 Shortness of breath: Secondary | ICD-10-CM | POA: Diagnosis not present

## 2018-06-13 DIAGNOSIS — G4733 Obstructive sleep apnea (adult) (pediatric): Secondary | ICD-10-CM | POA: Diagnosis not present

## 2018-06-13 DIAGNOSIS — R0602 Shortness of breath: Secondary | ICD-10-CM | POA: Diagnosis not present

## 2018-07-09 DIAGNOSIS — Z432 Encounter for attention to ileostomy: Secondary | ICD-10-CM | POA: Diagnosis not present

## 2018-07-09 DIAGNOSIS — Z932 Ileostomy status: Secondary | ICD-10-CM | POA: Diagnosis not present

## 2018-07-10 DIAGNOSIS — S72351D Displaced comminuted fracture of shaft of right femur, subsequent encounter for closed fracture with routine healing: Secondary | ICD-10-CM | POA: Diagnosis not present

## 2018-07-10 DIAGNOSIS — M2170 Unequal limb length (acquired), unspecified site: Secondary | ICD-10-CM | POA: Diagnosis not present

## 2018-08-03 DIAGNOSIS — Z932 Ileostomy status: Secondary | ICD-10-CM | POA: Diagnosis not present

## 2018-08-03 DIAGNOSIS — Z432 Encounter for attention to ileostomy: Secondary | ICD-10-CM | POA: Diagnosis not present

## 2018-10-29 DIAGNOSIS — Z932 Ileostomy status: Secondary | ICD-10-CM | POA: Diagnosis not present

## 2018-10-29 DIAGNOSIS — Z432 Encounter for attention to ileostomy: Secondary | ICD-10-CM | POA: Diagnosis not present

## 2018-11-13 DIAGNOSIS — Z932 Ileostomy status: Secondary | ICD-10-CM | POA: Diagnosis not present

## 2018-11-13 DIAGNOSIS — Z432 Encounter for attention to ileostomy: Secondary | ICD-10-CM | POA: Diagnosis not present

## 2018-11-28 ENCOUNTER — Encounter: Payer: Self-pay | Admitting: Gastroenterology

## 2018-11-28 DIAGNOSIS — Z932 Ileostomy status: Secondary | ICD-10-CM | POA: Diagnosis not present

## 2018-11-29 ENCOUNTER — Other Ambulatory Visit: Payer: Self-pay | Admitting: Surgery

## 2018-11-29 DIAGNOSIS — R198 Other specified symptoms and signs involving the digestive system and abdomen: Secondary | ICD-10-CM

## 2018-12-06 ENCOUNTER — Other Ambulatory Visit: Payer: BLUE CROSS/BLUE SHIELD

## 2018-12-12 ENCOUNTER — Ambulatory Visit
Admission: RE | Admit: 2018-12-12 | Discharge: 2018-12-12 | Disposition: A | Discharge status: Home / Self Care | Payer: BC Managed Care – PPO | Source: Ambulatory Visit | Attending: Surgery | Admitting: Surgery

## 2018-12-12 DIAGNOSIS — R198 Other specified symptoms and signs involving the digestive system and abdomen: Secondary | ICD-10-CM

## 2018-12-12 DIAGNOSIS — Z01818 Encounter for other preprocedural examination: Secondary | ICD-10-CM | POA: Diagnosis not present

## 2018-12-13 DIAGNOSIS — Z432 Encounter for attention to ileostomy: Secondary | ICD-10-CM | POA: Diagnosis not present

## 2018-12-13 DIAGNOSIS — Z932 Ileostomy status: Secondary | ICD-10-CM | POA: Diagnosis not present

## 2018-12-18 ENCOUNTER — Encounter: Payer: Self-pay | Admitting: Gastroenterology

## 2018-12-18 ENCOUNTER — Other Ambulatory Visit: Payer: Self-pay

## 2018-12-18 ENCOUNTER — Ambulatory Visit (INDEPENDENT_AMBULATORY_CARE_PROVIDER_SITE_OTHER): Payer: BC Managed Care – PPO | Admitting: Gastroenterology

## 2018-12-18 VITALS — BP 106/70 | HR 92 | Temp 98.2°F | Ht 69.5 in | Wt 182.1 lb

## 2018-12-18 DIAGNOSIS — Z432 Encounter for attention to ileostomy: Secondary | ICD-10-CM | POA: Diagnosis not present

## 2018-12-18 DIAGNOSIS — S36508D Unspecified injury of other part of colon, subsequent encounter: Secondary | ICD-10-CM

## 2018-12-18 NOTE — Patient Instructions (Addendum)
You have been scheduled for a colonoscopy. Please follow written instructions given to you at your visit today.  Please pick up your prep supplies at the pharmacy within the next 1-3 days. If you use inhalers (even only as needed), please bring them with you on the day of your procedure. Your physician has requested that you go to [email or web address] and enter the access code given to you at your visit today. This web site gives a general overview about your procedure. However, you should still follow specific instructions given to you by our office regarding your preparation for the procedure.  Please purchase 2 Fleet Enema's at your drug store  Thank you for entrusting me with your care and choosing [facility].  Dr Ardis Hughs
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
## 2018-12-20 ENCOUNTER — Telehealth: Payer: Self-pay

## 2018-12-20 DIAGNOSIS — S36508A Unspecified injury of other part of colon, initial encounter: Secondary | ICD-10-CM | POA: Diagnosis not present

## 2018-12-20 NOTE — Telephone Encounter (Signed)
Covid-19 screening questions   Do you now or have you had a fever in the last 14 days?  Do you have any respiratory symptoms of shortness of breath or cough now or in the last 14 days?  Do you have any family members or close contacts with diagnosed or suspected Covid-19 in the past 14 days?  Have you been tested for Covid-19 and found to be positive?    Left message to c/b   

## 2018-12-20 NOTE — Telephone Encounter (Signed)
Patient answered "NO" to all covid-19 questions °

## 2018-12-21 ENCOUNTER — Ambulatory Visit (AMBULATORY_SURGERY_CENTER): Payer: BC Managed Care – PPO | Admitting: Gastroenterology

## 2018-12-21 ENCOUNTER — Encounter: Payer: Self-pay | Admitting: Gastroenterology

## 2018-12-21 ENCOUNTER — Other Ambulatory Visit: Payer: Self-pay

## 2018-12-21 VITALS — BP 110/72 | HR 65 | Temp 98.6°F | Resp 12 | Ht 69.0 in | Wt 182.0 lb

## 2018-12-21 DIAGNOSIS — Z432 Encounter for attention to ileostomy: Secondary | ICD-10-CM | POA: Diagnosis not present

## 2018-12-21 DIAGNOSIS — K5289 Other specified noninfective gastroenteritis and colitis: Secondary | ICD-10-CM | POA: Diagnosis not present

## 2018-12-21 DIAGNOSIS — S36508D Unspecified injury of other part of colon, subsequent encounter: Secondary | ICD-10-CM | POA: Diagnosis not present

## 2018-12-21 DIAGNOSIS — Z932 Ileostomy status: Secondary | ICD-10-CM | POA: Diagnosis not present

## 2018-12-21 MED ORDER — SODIUM CHLORIDE 0.9 % IV SOLN: 500.0000 mL | Freq: Once | INTRAVENOUS | Status: DC

## 2018-12-21 NOTE — Patient Instructions (Signed)
YOU HAD AN ENDOSCOPIC PROCEDURE TODAY AT THE [facility]:   Refer to the procedure report that was given to you for any specific questions about what was found during the examination.  If the procedure report does not answer your questions, please call your gastroenterologist to clarify.  If you requested that your care partner not be given the details of your procedure findings, then the procedure report has been included in a sealed envelope for you to review at your convenience later.  YOU SHOULD EXPECT: Some feelings of bloating in the abdomen. Passage of more gas than usual.  Walking can help get rid of the air that was put into your GI tract during the procedure and reduce the bloating. If you had a lower endoscopy (such as a colonoscopy or flexible sigmoidoscopy) you may notice spotting of blood in your stool or on the toilet paper. If you underwent a bowel prep for your procedure, you may not have a normal bowel movement for a few days.  Please Note:  You might notice some irritation and congestion in your nose or some drainage.  This is from the oxygen used during your procedure.  There is no need for concern and it should clear up in a day or so.  SYMPTOMS TO REPORT IMMEDIATELY:   Following lower endoscopy (colonoscopy or flexible sigmoidoscopy):  Excessive amounts of blood in the stool  Significant tenderness or worsening of abdominal pains  Swelling of the abdomen that is new, acute  Fever of 100F or higher   For urgent or emergent issues, a gastroenterologist can be reached at any hour by calling [phone].   DIET:  We do recommend a small meal at first, but then you may proceed to your regular diet.  Drink plenty of fluids but you should avoid alcoholic beverages for 24 hours.  MEDICATIONS: Continue present medications.  Please see handouts given to you by your recovery nurse.  ACTIVITY:  You should plan to take it easy for the rest of today and you should  NOT DRIVE or use heavy machinery until tomorrow (because of the sedation medicines used during the test).    FOLLOW UP: Our staff will call the number listed on your records 48-72 hours following your procedure to check on you and address any questions or concerns that you may have regarding the information given to you following your procedure. If we do not reach you, we will leave a message.  We will attempt to reach you two times.  During this call, we will ask if you have developed any symptoms of COVID 19. If you develop any symptoms (ie: fever, flu-like symptoms, shortness of breath, cough etc.) before then, please call [phone].  If you test positive for Covid 19 in the 2 weeks post procedure, please call and report this information to us.    If any biopsies were taken you will be contacted by phone or by letter within the next 1-3 weeks.  Please call us at [phone] if you have not heard about the biopsies in 3 weeks.   Thank you for allowing us to provide for your healthcare needs today.   SIGNATURES/CONFIDENTIALITY: You and/or your care partner have signed paperwork which will be entered into your electronic medical record.  These signatures attest to the fact that that the information above on your After Visit Summary has been reviewed and is understood.  Full responsibility of the confidentiality of this discharge information lies with you and/or   your care-partner. 

## 2018-12-21 NOTE — Progress Notes (Signed)
Pt's states no medical or surgical changes since previsit or office visit.  JB temps and SM vitals.  Pt reported doing enemas x 2 for his prep with just "the enema water" as the result.

## 2018-12-21 NOTE — Op Note (Signed)
[place] Patient Name: Richard [name] Procedure Date: 12/21/2018 2:40 PM MRN: [id number] Endoscopist: Milus Banister , MD Age: 23 Referring MD:  Date of Birth: February 20, 1996 Gender: Male Account #: [id number] Procedure:                Colonoscopy Indications:              Traumatic perforation of right colon, s/p emergency                            surgery including right colectomy and ileostomy;                            for takedown in near future. Medicines:                Monitored Anesthesia Care Procedure:                Pre-Anesthesia Assessment:                           - Prior to the procedure, a History and Physical                            was performed, and patient medications and                            allergies were reviewed. The patient's tolerance of                            previous anesthesia was also reviewed. The risks                            and benefits of the procedure and the sedation                            options and risks were discussed with the patient.                            All questions were answered, and informed consent                            was obtained. Prior Anticoagulants: The patient has                            taken no previous anticoagulant or antiplatelet                            agents. ASA Grade Assessment: II - A patient with                            mild systemic disease. After reviewing the risks                            and benefits, the patient was deemed in  satisfactory condition to undergo the procedure.                           After obtaining informed consent, the colonoscope                            was passed under direct vision. Throughout the                            procedure, the patient's blood pressure, pulse, and                            oxygen saturations were monitored continuously. The                            Colonoscope was introduced  through the anus and                            then replaced with a gastroscope after encountering                            significant tortuosity if the left colon. The                            patient tolerated the procedure well. The quality                            of the bowel preparation was good. The rectum was                            photographed. Scope In: 2:43:37 PM Scope Out: 2:56:45 PM Total Procedure Duration: 0 hours 13 minutes 8 seconds  Findings:                 There were changes of mild diversion colitis                            (distal > proximal) consisting of erosions, friable                            mucosa.                           The proximal end of the colon (oversewn) was noted,                            at approximately 80-90cm from the anus.                           The exam was otherwise without abnormality on                            direct and retroflexion views. Complications:            No immediate complications. Estimated blood loss:  None. Estimated Blood Loss:     Estimated blood loss: none. Impression:               There were changes of mild diversion colitis                            (distal > proximal) consisting of erosions, friable                            mucosa.                           The proximal end of the colon (oversewn) was noted,                            at approximately 80-90 cm from the anus.                           OK to proceed with ileostomy reversal from my                            perspective. Recommendation:           - Patient has a contact number available for                            emergencies. The signs and symptoms of potential                            delayed complications were discussed with the                            patient. Return to normal activities tomorrow.                            Written discharge instructions were provided to the                             patient.                           - Resume previous diet.                           - Continue present medications.                           - Repeat colonoscopy at age 23 for screening                            purposes. Rachael Feeaniel P Jacobs, MD 12/21/2018 3:07:07 PM This report has been signed electronically.

## 2018-12-21 NOTE — Progress Notes (Signed)
PT taken to PACU. Monitors in place. VSS. Report given to RN. 

## 2018-12-25 ENCOUNTER — Telehealth: Payer: Self-pay | Admitting: *Deleted

## 2018-12-25 NOTE — Telephone Encounter (Signed)
  Follow up Call-  Call back number 12/21/2018  Post procedure Call Back phone  # [phone]  Permission to leave phone message Yes  Some recent data might be hidden     Patient questions:  Do you have a fever, pain , or abdominal swelling? No. Pain Score  0 *  Have you tolerated food without any problems? Yes.    Have you been able to return to your normal activities? Yes.    Do you have any questions about your discharge instructions: Diet   No. Medications  No. Follow up visit  No.  Do you have questions or concerns about your Care? No.  Actions: * If pain score is 4 or above: No action needed, pain <4.   1. Have you developed a fever since your procedure? NO  2.   Have you had an respiratory symptoms (SOB or cough) since your procedure? NO  3.   Have you tested positive for COVID 19 since your procedure NO  4.   Have you had any family members/close contacts diagnosed with the COVID 19 since your procedure?  NO   If yes to any of these questions please route to Joylene John, RN and Alphonsa Gin, RN.

## 2019-01-16 DIAGNOSIS — Z932 Ileostomy status: Secondary | ICD-10-CM | POA: Diagnosis not present

## 2019-01-21 DIAGNOSIS — Z932 Ileostomy status: Secondary | ICD-10-CM | POA: Diagnosis not present

## 2019-01-21 DIAGNOSIS — Z432 Encounter for attention to ileostomy: Secondary | ICD-10-CM | POA: Diagnosis not present

## 2019-01-28 ENCOUNTER — Ambulatory Visit: Payer: Self-pay | Admitting: Surgery
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
## 2019-01-29 DIAGNOSIS — Z932 Ileostomy status: Secondary | ICD-10-CM | POA: Diagnosis not present

## 2019-01-29 DIAGNOSIS — Z432 Encounter for attention to ileostomy: Secondary | ICD-10-CM | POA: Diagnosis not present

## 2019-01-30 DIAGNOSIS — Z932 Ileostomy status: Secondary | ICD-10-CM | POA: Diagnosis not present

## 2019-01-30 DIAGNOSIS — Z432 Encounter for attention to ileostomy: Secondary | ICD-10-CM | POA: Diagnosis not present

## 2019-02-02 ENCOUNTER — Observation Stay (HOSPITAL_COMMUNITY)
Admission: EM | Admit: 2019-02-02 | Discharge: 2019-02-03 | Disposition: A | Discharge status: Home / Self Care | Payer: BC Managed Care – PPO | Attending: Internal Medicine | Admitting: Internal Medicine

## 2019-02-02 ENCOUNTER — Emergency Department (HOSPITAL_COMMUNITY): Payer: BC Managed Care – PPO

## 2019-02-02 ENCOUNTER — Encounter (HOSPITAL_COMMUNITY): Payer: Self-pay | Admitting: *Deleted

## 2019-02-02 ENCOUNTER — Other Ambulatory Visit: Payer: Self-pay

## 2019-02-02 DIAGNOSIS — Z932 Ileostomy status: Secondary | ICD-10-CM | POA: Diagnosis not present

## 2019-02-02 DIAGNOSIS — A46 Erysipelas: Secondary | ICD-10-CM | POA: Insufficient documentation

## 2019-02-02 DIAGNOSIS — R1033 Periumbilical pain: Secondary | ICD-10-CM | POA: Diagnosis not present

## 2019-02-02 DIAGNOSIS — R6520 Severe sepsis without septic shock: Secondary | ICD-10-CM | POA: Insufficient documentation

## 2019-02-02 DIAGNOSIS — L03311 Cellulitis of abdominal wall: Principal | ICD-10-CM | POA: Diagnosis present

## 2019-02-02 DIAGNOSIS — D72829 Elevated white blood cell count, unspecified: Secondary | ICD-10-CM | POA: Insufficient documentation

## 2019-02-02 DIAGNOSIS — A267 Erysipelothrix sepsis: Secondary | ICD-10-CM | POA: Insufficient documentation

## 2019-02-02 DIAGNOSIS — E872 Acidosis, unspecified: Secondary | ICD-10-CM | POA: Diagnosis present

## 2019-02-02 DIAGNOSIS — Z888 Allergy status to other drugs, medicaments and biological substances status: Secondary | ICD-10-CM | POA: Diagnosis not present

## 2019-02-02 DIAGNOSIS — Z20828 Contact with and (suspected) exposure to other viral communicable diseases: Secondary | ICD-10-CM | POA: Insufficient documentation

## 2019-02-02 DIAGNOSIS — E871 Hypo-osmolality and hyponatremia: Secondary | ICD-10-CM | POA: Diagnosis present

## 2019-02-02 DIAGNOSIS — E860 Dehydration: Secondary | ICD-10-CM | POA: Diagnosis not present

## 2019-02-02 DIAGNOSIS — R0602 Shortness of breath: Secondary | ICD-10-CM | POA: Diagnosis not present

## 2019-02-02 LAB — COMPREHENSIVE METABOLIC PANEL
ALT: 38 U/L (ref 0–44)
AST: 23 U/L (ref 15–41)
Albumin: 4.8 g/dL (ref 3.5–5.0)
Alkaline Phosphatase: 153 U/L — ABNORMAL HIGH (ref 38–126)
Anion gap: 13 (ref 5–15)
BUN: 14 mg/dL (ref 6–20)
CO2: 17 mmol/L — ABNORMAL LOW (ref 22–32)
Calcium: 9.3 mg/dL (ref 8.9–10.3)
Chloride: 99 mmol/L (ref 98–111)
Creatinine, Ser: 0.87 mg/dL (ref 0.61–1.24)
GFR calc Af Amer: >60 mL/min (ref >60)
GFR calc non Af Amer: >60 mL/min (ref >60)
Glucose, Bld: 108 mg/dL — ABNORMAL HIGH (ref 70–99)
Potassium: 4 mmol/L (ref 3.5–5.1)
Sodium: 129 mmol/L — ABNORMAL LOW (ref 135–145)
Total Bilirubin: 1.2 mg/dL (ref 0.3–1.2)
Total Protein: 8.3 g/dL — ABNORMAL HIGH (ref 6.5–8.1)

## 2019-02-02 LAB — LACTIC ACID, PLASMA
Lactic Acid, Venous: 2.2 mmol/L (ref 0.5–1.9)
Lactic Acid, Venous: 1.7 mmol/L (ref 0.5–1.9)

## 2019-02-02 LAB — PROTIME-INR
INR: 0.9 (ref 0.8–1.2)
Prothrombin Time: 12.3 seconds (ref 11.4–15.2)

## 2019-02-02 LAB — CBC WITH DIFFERENTIAL/PLATELET
Abs Immature Granulocytes: 0.06 10*3/uL (ref 0.00–0.07)
Basophils Absolute: 0 10*3/uL (ref 0.0–0.1)
Basophils Relative: 0 %
Eosinophils Absolute: 0.1 10*3/uL (ref 0.0–0.5)
Eosinophils Relative: 1 %
HCT: 49.9 % (ref 39.0–52.0)
Hemoglobin: 18.1 g/dL — ABNORMAL HIGH (ref 13.0–17.0)
Immature Granulocytes: 0 %
Lymphocytes Relative: 13 %
Lymphs Abs: 1.8 10*3/uL (ref 0.7–4.0)
MCH: 30.2 pg (ref 26.0–34.0)
MCHC: 36.3 g/dL — ABNORMAL HIGH (ref 30.0–36.0)
MCV: 83.2 fL (ref 80.0–100.0)
Monocytes Absolute: 1.4 10*3/uL — ABNORMAL HIGH (ref 0.1–1.0)
Monocytes Relative: 10 %
Neutro Abs: 10.5 10*3/uL — ABNORMAL HIGH (ref 1.7–7.7)
Neutrophils Relative %: 76 %
Platelets: 297 10*3/uL (ref 150–400)
RBC: 6 MIL/uL — ABNORMAL HIGH (ref 4.22–5.81)
RDW: 12.1 % (ref 11.5–15.5)
WBC: 13.9 10*3/uL — ABNORMAL HIGH (ref 4.0–10.5)
nRBC: 0 % (ref 0.0–0.2)

## 2019-02-02 MED ORDER — ONDANSETRON HCL 4 MG PO TABS: 4.0000 mg | ORAL_TABLET | Freq: Four times a day (QID) | ORAL | Status: DC | PRN

## 2019-02-02 MED ORDER — VANCOMYCIN HCL 10 G IV SOLR
1500.0000 mg | Freq: Once | INTRAVENOUS | Status: AC
  Administered 2019-02-02: 17:00:00 1500 mg via INTRAVENOUS
  Filled 2019-02-02: qty 1500

## 2019-02-02 MED ORDER — SODIUM CHLORIDE 0.9 % IV BOLUS
1000.0000 mL | Freq: Once | INTRAVENOUS | Status: AC
  Administered 2019-02-02: 16:00:00 1000 mL via INTRAVENOUS

## 2019-02-02 MED ORDER — VANCOMYCIN HCL 10 G IV SOLR
1750.0000 mg | Freq: Two times a day (BID) | INTRAVENOUS | Status: DC
  Administered 2019-02-03: 06:00:00 1750 mg via INTRAVENOUS
  Filled 2019-02-02 (×3): qty 1750

## 2019-02-02 MED ORDER — ACETAMINOPHEN 650 MG RE SUPP: 650.0000 mg | Freq: Four times a day (QID) | RECTAL | Status: DC | PRN

## 2019-02-02 MED ORDER — ACETAMINOPHEN 325 MG PO TABS
650.0000 mg | ORAL_TABLET | Freq: Four times a day (QID) | ORAL | Status: DC | PRN
  Administered 2019-02-03: 650 mg via ORAL
  Filled 2019-02-02: qty 2

## 2019-02-02 MED ORDER — ONDANSETRON HCL 4 MG/2ML IJ SOLN: 4.0000 mg | Freq: Four times a day (QID) | INTRAMUSCULAR | Status: DC | PRN

## 2019-02-02 MED ORDER — SODIUM CHLORIDE 0.9 % IV SOLN
2.0000 g | Freq: Three times a day (TID) | INTRAVENOUS | Status: DC
  Administered 2019-02-02 – 2019-02-03 (×2): 2 g via INTRAVENOUS
  Filled 2019-02-02 (×2): qty 2

## 2019-02-02 MED ORDER — SODIUM CHLORIDE 0.9 % IV SOLN
INTRAVENOUS | Status: DC
  Administered 2019-02-02 – 2019-02-03 (×2): via INTRAVENOUS

## 2019-02-02 NOTE — ED Notes (Signed)
Report given to 5N 
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
## 2019-02-02 NOTE — ED Notes (Signed)
ED TO INPATIENT HANDOFF REPORT  ED Nurse Name and Phone #:  Lord Lancour [phone]   S Name/Age/Gender Richard E Chaviano Jr. 23 y.o. male Room/Bed: 024C/024C  Code Status   Code Status: Full Code  Home/SNF/Other Home Patient oriented to: self, place, time and situation Is this baseline? Yes   Triage Complete: Triage complete  Chief Complaint abd swelling/rash  Triage Note Pt states sob since Thursday and redness/warmth around colostomy bag.  Also c/o hot flashes.   Allergies Allergies  Allergen Reactions  . Chlorhexidine Rash    "the wipes they gave me a bath with"    Level of Care/Admitting Diagnosis ED Disposition    ED Disposition Condition Comment   Admit  [facility] [[id number]]  Level of Care: Med-Surg [16]  I expect the patient will be discharged within 24 hours: No (not a candidate for 5C-Observation unit)  Covid Evaluation: Asymptomatic Screening Protocol (No Symptoms)  Diagnosis: Abdominal wall cellulitis [[id number]][[id number]]  Admitting Physician: Ollen BowlPAHWANI, RINKA R [[id number]]  Attending Physician: Ollen BowlPAHWANI, RINKA R [phone][[id number]]  PT Class (Do Not Modify): Observation [104]  PT Acc Code (Do Not Modify): Observation [10022]       B Medical/Surgery History History reviewed. No pertinent past medical history. Past Surgical History:  Procedure Laterality Date  . APPLICATION OF WOUND VAC N/A 02/28/2018   Procedure: APPLICATION OF WOUND VAC;  Surgeon: Jimmye NormanWyatt, James, MD;  [facility];  Service: General;  Laterality: N/A;  . BOWEL RESECTION N/A 02/28/2018   Procedure: SMALL BOWEL RESECTION;  Surgeon: Jimmye NormanWyatt, James, MD;  Location: [facility] OR;  Service: General;  Laterality: N/A;  . COLOSTOMY    . CYSTOSCOPY WITH URETEROSCOPY AND STENT PLACEMENT Right 05/24/2018   Procedure: CYSTOSCOPY/RIGHT STENT REMOVAL Theador Hawthorne/CYSTOGRAM;  Surgeon: Bjorn PippinWrenn, John, MD;  [facility] [place];  Service: Urology;  Laterality: Right;  . EXTERNAL FIXATION LEG Right  02/28/2018   Procedure: EXTERNAL FIXATION RIGHT LEG;  Surgeon: Roby LoftsHaddix, Kevin P, MD;  Location: [facility] OR;  Service: Orthopedics;  Laterality: Right;  . EXTERNAL FIXATION REMOVAL Right 03/01/2018   Procedure: REMOVAL EXTERNAL FIXATION LEG;  Surgeon: Roby LoftsHaddix, Kevin P, MD;  Location: [facility] OR;  Service: Orthopedics;  Laterality: Right;  . FEMUR IM NAIL Right 03/01/2018   Procedure: INTRAMEDULLARY (IM) NAIL FEMORAL;  Surgeon: Roby LoftsHaddix, Kevin P, MD;  Location: [facility] OR;  Service: Orthopedics;  Laterality: Right;  . LAPAROTOMY N/A 02/28/2018   Procedure: EXPLORATORY LAPAROTOMY;  Surgeon: Jimmye NormanWyatt, James, MD;  [facility];  Service: General;  Laterality: N/A;  . LAPAROTOMY N/A 03/12/2018   Procedure: EXPLORATORY LAPAROTOMY WITH SMALL BOWEL RESECTION AND ILEOSTOMY, PSAOAS WITH BOARIFLAP AND URETERAL REIMPLANTATION RIGHT.;  Surgeon: Jimmye NormanWyatt, James, MD;  Location: [facility] OR;  Service: General;  Laterality: N/A;     A IV Location/Drains/Wounds Patient Lines/Drains/Airways Status   Active Line/Drains/Airways    Name:   Placement date:   Placement time:   Site:   Days:   Peripheral IV 02/02/19 Right Antecubital   02/02/19    1534    Antecubital   less than 1   Ileostomy RLQ   03/12/18    1953    RLQ   327   Incision (Closed) 03/12/18 Abdomen Other (Comment)   03/12/18    1945     327   Incision (Closed) 05/24/18 Perineum Other (Comment)   05/24/18    1020     254          Intake/Output Last 24 hours No intake  or output data in the 24 hours ending 02/02/19 1731  Labs/Imaging Results for orders placed or performed during the hospital encounter of 02/02/19 (from the past 48 hour(s))  Comprehensive metabolic panel     Status: Abnormal   Collection Time: 02/02/19  2:52 PM  Result Value Ref Range   Sodium 129 (L) 135 - 145 mmol/L   Potassium 4.0 3.5 - 5.1 mmol/L   Chloride 99 98 - 111 mmol/L   CO2 17 (L) 22 - 32 mmol/L   Glucose, Bld 108 (H) 70 - 99 mg/dL   BUN 14 6 - 20 mg/dL   Creatinine, Ser 0.87 0.61 - 1.24  mg/dL   Calcium 9.3 8.9 - 10.3 mg/dL   Total Protein 8.3 (H) 6.5 - 8.1 g/dL   Albumin 4.8 3.5 - 5.0 g/dL   AST 23 15 - 41 U/L   ALT 38 0 - 44 U/L   Alkaline Phosphatase 153 (H) 38 - 126 U/L   Total Bilirubin 1.2 0.3 - 1.2 mg/dL   GFR calc non Af Amer >60 >60 mL/min   GFR calc Af Amer >60 >60 mL/min   Anion gap 13 5 - 15    Comment: Performed at [facility], [address], [place], [place] [address]  Lactic acid, plasma     Status: Abnormal   Collection Time: 02/02/19  2:52 PM  Result Value Ref Range   Lactic Acid, Venous 2.2 (HH) 0.5 - 1.9 mmol/L    Comment: CRITICAL RESULT CALLED TO, READ BACK BY AND VERIFIED WITH: MCKEOWN, B RN @ 1523 ON 02/02/2019 BY TEMOCHE, H Performed at [facility], [place] [address] [place], [place] [address]   CBC with Differential     Status: Abnormal   Collection Time: 02/02/19  2:52 PM  Result Value Ref Range   WBC 13.9 (H) 4.0 - 10.5 K/uL   RBC 6.00 (H) 4.22 - 5.81 MIL/uL   Hemoglobin 18.1 (H) 13.0 - 17.0 g/dL   HCT 49.9 39.0 - 52.0 %   MCV 83.2 80.0 - 100.0 fL   MCH 30.2 26.0 - 34.0 pg   MCHC 36.3 (H) 30.0 - 36.0 g/dL   RDW 12.1 11.5 - 15.5 %   Platelets 297 150 - 400 K/uL   nRBC 0.0 0.0 - 0.2 %   Neutrophils Relative % 76 %   Neutro Abs 10.5 (H) 1.7 - 7.7 K/uL   Lymphocytes Relative 13 %   Lymphs Abs 1.8 0.7 - 4.0 K/uL   Monocytes Relative 10 %   Monocytes Absolute 1.4 (H) 0.1 - 1.0 K/uL   Eosinophils Relative 1 %   Eosinophils Absolute 0.1 0.0 - 0.5 K/uL   Basophils Relative 0 %   Basophils Absolute 0.0 0.0 - 0.1 K/uL   Immature Granulocytes 0 %   Abs Immature Granulocytes 0.06 0.00 - 0.07 K/uL    Comment: Performed at [facility], [place] [address] [place], [place] [address]  Protime-INR     Status: None   Collection Time: 02/02/19  2:52 PM  Result Value Ref Range   Prothrombin Time 12.3 11.4 - 15.2 seconds   INR 0.9 0.8 - 1.2    Comment: (NOTE) INR goal varies based on device and disease states. Performed at  [facility], [place] [address], [place], [place] [address]    Dg Chest Portable 1 View  Result Date: 02/02/2019 CLINICAL DATA:  Shortness of breath EXAM: PORTABLE CHEST 1 VIEW COMPARISON:  05/22/2018  FINDINGS: The heart size and mediastinal contours are within normal limits. No acute appearing airspace opacity. There are scattered, stable, benign-appearing and possibly calcified pulmonary nodules bilaterally. The visualized skeletal structures are unremarkable. IMPRESSION: No acute abnormality of the lungs. Electronically Signed   By: Lauralyn Primes M.D.   On: 02/02/2019 15:21    Pending Labs Unresulted Labs (From admission, onward)    Start     Ordered   02/03/19 0500  Basic metabolic panel  Tomorrow morning,   R     02/02/19 1654   02/03/19 0500  CBC  Tomorrow morning,   R     02/02/19 1654   02/02/19 1616  SARS CORONAVIRUS 2 (TAT 6-24 HRS) Nasopharyngeal Nasopharyngeal Swab  (Asymptomatic/Tier 2 Patients Labs)  Once,   STAT    Question Answer Comment  Is this test for diagnosis or screening Screening   Symptomatic for COVID-19 as defined by CDC No   Hospitalized for COVID-19 No   Admitted to ICU for COVID-19 No   Previously tested for COVID-19 No   Resident in a congregate (group) care setting No   Employed in healthcare setting No      02/02/19 1615   02/02/19 1452  Lactic acid, plasma  Now then every 2 hours,   STAT     02/02/19 1452   02/02/19 1452  Culture, blood (Routine x 2)  BLOOD CULTURE X 2,   STAT     02/02/19 1452   02/02/19 1452  Urinalysis, Routine w reflex microscopic  ONCE - STAT,   STAT     02/02/19 1452          Vitals/Pain Today's Vitals   02/02/19 1545 02/02/19 1600 02/02/19 1615 02/02/19 1630  BP: 125/82 131/86 126/80 132/81  Pulse: 97 95 96 86  Resp: 20 14 19 18   Temp:      TempSrc:      SpO2: 97% 97% 98% 97%  PainSc:        Isolation Precautions No active isolations  Medications Medications  vancomycin (VANCOCIN) 1,500 mg in sodium  chloride 0.9 % 500 mL IVPB (1,500 mg Intravenous New Bag/Given 02/02/19 1711)  acetaminophen (TYLENOL) tablet 650 mg (has no administration in time range)    Or  acetaminophen (TYLENOL) suppository 650 mg (has no administration in time range)  ondansetron (ZOFRAN) tablet 4 mg (has no administration in time range)    Or  ondansetron (ZOFRAN) injection 4 mg (has no administration in time range)  0.9 %  sodium chloride infusion (has no administration in time range)  sodium chloride 0.9 % bolus 1,000 mL (1,000 mLs Intravenous New Bag/Given 02/02/19 1537)    Mobility walks     Focused Assessments gi   R Recommendations: See Admitting Provider Note  Report given to:   Additional Notes: \

## 2019-02-02 NOTE — ED Triage Notes (Signed)
Pt states sob since Thursday and redness/warmth around colostomy bag.  Also c/o hot flashes.

## 2019-02-02 NOTE — ED Notes (Signed)
Ekg reviewed by dr. Francia Greaves.

## 2019-02-03 DIAGNOSIS — Z932 Ileostomy status: Secondary | ICD-10-CM | POA: Diagnosis not present

## 2019-02-03 DIAGNOSIS — E860 Dehydration: Secondary | ICD-10-CM | POA: Diagnosis not present

## 2019-02-03 DIAGNOSIS — E871 Hypo-osmolality and hyponatremia: Secondary | ICD-10-CM | POA: Diagnosis not present

## 2019-02-03 DIAGNOSIS — E872 Acidosis: Secondary | ICD-10-CM

## 2019-02-03 DIAGNOSIS — L03311 Cellulitis of abdominal wall: Secondary | ICD-10-CM | POA: Diagnosis not present

## 2019-02-03 LAB — BASIC METABOLIC PANEL
Anion gap: 8 (ref 5–15)
BUN: 9 mg/dL (ref 6–20)
CO2: 22 mmol/L (ref 22–32)
Calcium: 8.7 mg/dL — ABNORMAL LOW (ref 8.9–10.3)
Chloride: 105 mmol/L (ref 98–111)
Creatinine, Ser: 0.94 mg/dL (ref 0.61–1.24)
GFR calc Af Amer: >60 mL/min (ref >60)
GFR calc non Af Amer: >60 mL/min (ref >60)
Glucose, Bld: 95 mg/dL (ref 70–99)
Potassium: 4.2 mmol/L (ref 3.5–5.1)
Sodium: 135 mmol/L (ref 135–145)

## 2019-02-03 LAB — CBC
HCT: 42.6 % (ref 39.0–52.0)
Hemoglobin: 14.5 g/dL (ref 13.0–17.0)
MCH: 29.1 pg (ref 26.0–34.0)
MCHC: 34 g/dL (ref 30.0–36.0)
MCV: 85.5 fL (ref 80.0–100.0)
Platelets: 226 10*3/uL (ref 150–400)
RBC: 4.98 MIL/uL (ref 4.22–5.81)
RDW: 12.6 % (ref 11.5–15.5)
WBC: 8.4 10*3/uL (ref 4.0–10.5)
nRBC: 0 % (ref 0.0–0.2)

## 2019-02-03 LAB — SARS CORONAVIRUS 2 (TAT 6-24 HRS): SARS Coronavirus 2: NEGATIVE

## 2019-02-03 MED ORDER — CEPHALEXIN 500 MG PO CAPS: 500.0000 mg | ORAL_CAPSULE | Freq: Four times a day (QID) | ORAL | 0 refills | Status: DC

## 2019-02-03 MED ORDER — DOXYCYCLINE MONOHYDRATE 100 MG PO TABS: 100.0000 mg | ORAL_TABLET | Freq: Two times a day (BID) | ORAL | 0 refills | Status: DC

## 2019-02-03 NOTE — Plan of Care (Signed)
  Problem: Education: Goal: Knowledge of General Education information will improve Description: Including pain rating scale, medication(s)/side effects and non-pharmacologic comfort measures Outcome: Progressing   Problem: Clinical Measurements: Goal: Diagnostic test results will improve Outcome: Progressing   Problem: Skin Integrity: Goal: Risk for impaired skin integrity will decrease Outcome: Progressing   
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
## 2019-02-03 NOTE — Progress Notes (Signed)
AVS reviewed with patient, new medication discussed. IV removed from R FA. Denies pain, no acute distress. No questions or concerns from pt. Pt D/C to home at ~11:20. Walked down by staff.

## 2019-02-04 DIAGNOSIS — Z432 Encounter for attention to ileostomy: Secondary | ICD-10-CM | POA: Diagnosis not present

## 2019-02-04 DIAGNOSIS — Z932 Ileostomy status: Secondary | ICD-10-CM | POA: Diagnosis not present

## 2019-02-07 LAB — CULTURE, BLOOD (ROUTINE X 2)
Culture: NO GROWTH
Culture: NO GROWTH
Special Requests: ADEQUATE

## 2019-03-07 DIAGNOSIS — Z432 Encounter for attention to ileostomy: Secondary | ICD-10-CM | POA: Diagnosis not present

## 2019-03-07 DIAGNOSIS — Z932 Ileostomy status: Secondary | ICD-10-CM | POA: Diagnosis not present

## 2019-03-09 DIAGNOSIS — Z932 Ileostomy status: Secondary | ICD-10-CM | POA: Diagnosis not present

## 2019-03-09 DIAGNOSIS — Z432 Encounter for attention to ileostomy: Secondary | ICD-10-CM | POA: Diagnosis not present

## 2019-03-14 NOTE — Patient Instructions (Addendum)
DUE TO COVID-19 ONLY ONE VISITOR IS ALLOWED TO COME WITH YOU AND STAY IN THE WAITING ROOM ONLY DURING PRE OP AND PROCEDURE DAY OF SURGERY. THE 1 VISITOR MAY VISIT WITH YOU AFTER SURGERY IN YOUR PRIVATE ROOM DURING VISITING HOURS ONLY!  YOU NEED TO HAVE A COVID 19 TEST ON_Sat 11/14/20______ @_9 :30______, THIS TEST MUST BE DONE BEFORE SURGERY, COME  Richard [name] , [id number].  ([place]) ONCE YOUR COVID TEST IS COMPLETED, PLEASE BEGIN THE QUARANTINE INSTRUCTIONS AS OUTLINED IN YOUR HANDOUT.                Richard E Lybrand Jr.    Your procedure is scheduled on: 03/20/19   Report to [facility] Main  Entrance   Report to admitting at  6:30 AM     Call this number if you have problems the morning of surgery [place], NO CHEWING GUM [place].  Follow bowel prep from Dr. Tawanna Cooler office.   CLEAR LIQUID DIET  Foods Allowed                                                                     Foods Excluded  Water, Black Coffee and tea, regular and decaf                             liquids that you cannot  Plain Jell-O in any flavor  (No red)                                           see through such as: Fruit ices (not with fruit pulp)                                     milk, soups, orange juice  Iced Popsicles (No red)                                    All solid food Carbonated beverages, regular and diet                                    Apple juices Sports drinks like Gatorade (No red) Lightly seasoned clear broth or consume(fat free) Sugar, honey syrup  Drink plenty of liquids to prevent dehydration from the bowel prep   DRINK 2 PRESURGERY ENSURE DRINKS THE NIGHT BEFORE SURGERY AT  10:00 PM   NO SOLIDS AFTER MIDNIGHT THE DAY PRIOR TO THE SURGERY.   NOTHING BY MOUTH EXCEPT CLEAR LIQUIDS UNTIL 5:30 AM .   PLEASE FINISH THE THIRD PRESURGERY ENSURE DRINK BY 5:30 AM.  Then nothing by mouth  DO NOT TAKE ANY  MEDICATIONS DAY OF YOUR SURGERY              Bring ileostomy supplies  to the hospital with you.                               You may not have any metal on your body including piercings              Do not wear jewelry, lotions, powders or  deodorant                         Men may shave face and neck.   Do not bring valuables to the hospital. [facility] IS NOT             RESPONSIBLE   FOR VALUABLES.  Contacts, dentures or bridgework may not be worn into surgery.        Name and phone number of your driver:  Special Instructions: N/A              Please read over the following fact sheets you were given: _____________________________________________________________________             [facility] - Preparing for Surgery  Before surgery, you can play an important role.   Because skin is not sterile, your skin needs to be as free of germs as possible.   You can reduce the number of germs on your skin by washing with CHG (chlorahexidine gluconate) soap before surgery.   CHG is an antiseptic cleaner which kills germs and bonds with the skin to continue killing germs even after washing. Please DO NOT use if you have an allergy to CHG or antibacterial soaps .  If your skin becomes reddened/irritated stop using the CHG and inform your nurse when you arrive at Short Stay. .  You may shave your face/neck.  Please follow these instructions carefully:  1.  Shower with CHG Soap the night before surgery and the  morning of Surgery.  2.  If you choose to wash your hair, wash your hair first as usual with your  normal  shampoo.  3.  After you shampoo, rinse your hair and body thoroughly to remove the  shampoo.                                        4.  Use CHG as you would any other liquid soap.  You can apply chg directly  to the skin and wash                       Gently with a scrungie or clean washcloth.  5.  Apply the CHG Soap to your body ONLY  FROM THE NECK DOWN.   Do not use on face/ open                           Wound or open sores. Avoid contact with eyes, ears mouth and genitals (private parts).                       Wash face,  Genitals (private parts) with your normal soap.             6.  Wash thoroughly, paying special attention to the area where your surgery  will be performed.  7.  Thoroughly rinse your body with warm water from  the neck down.  8.  DO NOT shower/wash with your normal soap after using and rinsing off  the CHG Soap.             9.  Pat yourself dry with a clean towel.            10.  Wear clean pajamas.            11.  Place clean sheets on your bed the night of your first shower and do not  sleep with pets. Day of Surgery : Do not apply any lotions/deodorants the morning of surgery.  Please wear clean clothes to the hospital/surgery center.  FAILURE TO FOLLOW THESE INSTRUCTIONS MAY RESULT IN THE CANCELLATION OF YOUR SURGERY PATIENT SIGNATURE_________________________________  NURSE SIGNATURE__________________________________  ________________________________________________________________________   Rogelia MireIncentive Spirometer  An incentive spirometer is a tool that can help keep your lungs clear and active. This tool measures how well you are filling your lungs with each breath. Taking long deep breaths may help reverse or decrease the chance of developing breathing (pulmonary) problems (especially infection) following:  A long period of time when you are unable to move or be active. BEFORE THE PROCEDURE   If the spirometer includes an indicator to show your best effort, your nurse or respiratory therapist will set it to a desired goal.  If possible, sit up straight or lean slightly forward. Try not to slouch.  Hold the incentive spirometer in an upright position. INSTRUCTIONS FOR USE  1. Sit on the edge of your bed if possible, or sit up as far as you can in bed or on a chair. 2. Hold the incentive  spirometer in an upright position. 3. Breathe out normally. 4. Place the mouthpiece in your mouth and seal your lips tightly around it. 5. Breathe in slowly and as deeply as possible, raising the piston or the ball toward the top of the column. 6. Hold your breath for 3-5 seconds or for as long as possible. Allow the piston or ball to fall to the bottom of the column. 7. Remove the mouthpiece from your mouth and breathe out normally. 8. Rest for a few seconds and repeat Steps 1 through 7 at least 10 times every 1-2 hours when you are awake. Take your time and take a few normal breaths between deep breaths. 9. The spirometer may include an indicator to show your best effort. Use the indicator as a goal to work toward during each repetition. 10. After each set of 10 deep breaths, practice coughing to be sure your lungs are clear. If you have an incision (the cut made at the time of surgery), support your incision when coughing by placing a pillow or rolled up towels firmly against it. Once you are able to get out of bed, walk around indoors and cough well. You may stop using the incentive spirometer when instructed by your caregiver.  RISKS AND COMPLICATIONS  Take your time so you do not get dizzy or light-headed.  If you are in pain, you may need to take or ask for pain medication before doing incentive spirometry. It is harder to take a deep breath if you are having pain. AFTER USE  Rest and breathe slowly and easily.  It can be helpful to keep track of a log of your progress. Your caregiver can provide you with a simple table to help with this. If you are using the spirometer at home, follow these instructions: SEEK MEDICAL CARE IF:  You are having difficultly using the spirometer.  You have trouble using the spirometer as often as instructed.  Your pain medication is not giving enough relief while using the spirometer.  You develop fever of 100.5 F (38.1 C) or higher. SEEK  IMMEDIATE MEDICAL CARE IF:   You cough up bloody sputum that had not been present before.  You develop fever of 102 F (38.9 C) or greater.  You develop worsening pain at or near the incision site. MAKE SURE YOU:   Understand these instructions.  Will watch your condition.  Will get help right away if you are not doing well or get worse. Document Released: 08/29/2006 Document Revised: 07/11/2011 Document Reviewed: 10/30/2006 [facility] Patient Information 2014 [place], [place].   ________________________________________________________________________

## 2019-03-15 ENCOUNTER — Encounter (HOSPITAL_COMMUNITY): Payer: Self-pay

## 2019-03-15 ENCOUNTER — Other Ambulatory Visit: Payer: Self-pay

## 2019-03-15 ENCOUNTER — Encounter (HOSPITAL_COMMUNITY)
Admission: RE | Admit: 2019-03-15 | Discharge: 2019-03-15 | Disposition: A | Discharge status: Home / Self Care | Payer: BC Managed Care – PPO | Source: Ambulatory Visit | Attending: Surgery | Admitting: Surgery

## 2019-03-15 DIAGNOSIS — Z01812 Encounter for preprocedural laboratory examination: Secondary | ICD-10-CM | POA: Diagnosis not present

## 2019-03-15 DIAGNOSIS — Z932 Ileostomy status: Secondary | ICD-10-CM | POA: Insufficient documentation

## 2019-03-15 LAB — PROTIME-INR
INR: 1 (ref 0.8–1.2)
Prothrombin Time: 13.2 seconds (ref 11.4–15.2)

## 2019-03-15 LAB — CBC WITH DIFFERENTIAL/PLATELET
Abs Immature Granulocytes: 0.03 10*3/uL (ref 0.00–0.07)
Basophils Absolute: 0.1 10*3/uL (ref 0.0–0.1)
Basophils Relative: 1 %
Eosinophils Absolute: 0.1 10*3/uL (ref 0.0–0.5)
Eosinophils Relative: 1 %
HCT: 44.2 % (ref 39.0–52.0)
Hemoglobin: 14.7 g/dL (ref 13.0–17.0)
Immature Granulocytes: 0 %
Lymphocytes Relative: 26 %
Lymphs Abs: 2 10*3/uL (ref 0.7–4.0)
MCH: 28.9 pg (ref 26.0–34.0)
MCHC: 33.3 g/dL (ref 30.0–36.0)
MCV: 87 fL (ref 80.0–100.0)
Monocytes Absolute: 0.6 10*3/uL (ref 0.1–1.0)
Monocytes Relative: 7 %
Neutro Abs: 5.1 10*3/uL (ref 1.7–7.7)
Neutrophils Relative %: 65 %
Platelets: 316 10*3/uL (ref 150–400)
RBC: 5.08 MIL/uL (ref 4.22–5.81)
RDW: 12.7 % (ref 11.5–15.5)
WBC: 7.9 10*3/uL (ref 4.0–10.5)
nRBC: 0 % (ref 0.0–0.2)

## 2019-03-15 LAB — COMPREHENSIVE METABOLIC PANEL
ALT: 35 U/L (ref 0–44)
AST: 25 U/L (ref 15–41)
Albumin: 4.2 g/dL (ref 3.5–5.0)
Alkaline Phosphatase: 119 U/L (ref 38–126)
Anion gap: 8 (ref 5–15)
BUN: 13 mg/dL (ref 6–20)
CO2: 25 mmol/L (ref 22–32)
Calcium: 8.7 mg/dL — ABNORMAL LOW (ref 8.9–10.3)
Chloride: 106 mmol/L (ref 98–111)
Creatinine, Ser: 0.87 mg/dL (ref 0.61–1.24)
GFR calc Af Amer: >60 mL/min (ref >60)
GFR calc non Af Amer: >60 mL/min (ref >60)
Glucose, Bld: 84 mg/dL (ref 70–99)
Potassium: 4 mmol/L (ref 3.5–5.1)
Sodium: 139 mmol/L (ref 135–145)
Total Bilirubin: 1 mg/dL (ref 0.3–1.2)
Total Protein: 7.2 g/dL (ref 6.5–8.1)

## 2019-03-15 LAB — APTT: aPTT: 26 seconds (ref 24–36)

## 2019-03-15 LAB — ABO/RH: ABO/RH(D): A POS

## 2019-03-15 LAB — HEMOGLOBIN A1C
Hgb A1c MFr Bld: 5.2 % (ref 4.8–5.6)
Mean Plasma Glucose: 102.54 mg/dL

## 2019-03-15 NOTE — Progress Notes (Signed)
PCP - L. McRea NP Cardiologist - no  Chest x-ray - 02/02/19 EKG - 02/02/19 Stress Test - no ECHO - no Cardiac Cath - no  Sleep Study - no CPAP -   Fasting Blood Sugar - NA Checks Blood Sugar _____ times a day  Blood Thinner Instructions:NA Aspirin Instructions: Last Dose:  Anesthesia review:   Patient denies shortness of breath, fever, cough and chest pain at PAT appointment yes  Patient verbalized understanding of instructions that were given to them at the PAT appointment. Patient was also instructed that they will need to review over the PAT instructions again at home before surgery. yes

## 2019-03-16 ENCOUNTER — Other Ambulatory Visit (HOSPITAL_COMMUNITY)
Admission: RE | Admit: 2019-03-16 | Discharge: 2019-03-16 | Disposition: A | Discharge status: Home / Self Care | Payer: BC Managed Care – PPO | Source: Ambulatory Visit | Attending: Surgery | Admitting: Surgery

## 2019-03-16 DIAGNOSIS — Z20828 Contact with and (suspected) exposure to other viral communicable diseases: Secondary | ICD-10-CM | POA: Diagnosis not present

## 2019-03-16 DIAGNOSIS — Z01812 Encounter for preprocedural laboratory examination: Secondary | ICD-10-CM | POA: Insufficient documentation

## 2019-03-17 LAB — NOVEL CORONAVIRUS, NAA (HOSP ORDER, SEND-OUT TO REF LAB; TAT 18-24 HRS): SARS-CoV-2, NAA: NOT DETECTED

## 2019-03-19 MED ORDER — BUPIVACAINE LIPOSOME 1.3 % IJ SUSP
20.0000 mL | Freq: Once | INTRAMUSCULAR | Status: DC
  Filled 2019-03-19: qty 20

## 2019-03-19 NOTE — Anesthesia Preprocedure Evaluation (Addendum)
Anesthesia Evaluation  Patient identified by MRN, date of birth, ID band Patient awake    Reviewed: Allergy & Precautions, NPO status , Patient's Chart, lab work & pertinent test results  History of Anesthesia Complications Negative for: history of anesthetic complications  Airway Mallampati: II  TM Distance: >3 FB Neck ROM: Full    Dental  (+) Dental Advisory Given, Teeth Intact   Pulmonary neg pulmonary ROS,    Pulmonary exam normal        Cardiovascular negative cardio ROS Normal cardiovascular exam     Neuro/Psych negative neurological ROS  negative psych ROS   GI/Hepatic Neg liver ROS,  Ileostomy    Endo/Other  negative endocrine ROS  Renal/GU negative Renal ROS     Musculoskeletal negative musculoskeletal ROS (+)   Abdominal   Peds  Hematology negative hematology ROS (+)   Anesthesia Other Findings   Reproductive/Obstetrics                            Anesthesia Physical Anesthesia Plan  ASA: II  Anesthesia Plan: General   Post-op Pain Management:    Induction: Intravenous  PONV Risk Score and Plan: 4 or greater and Treatment may vary due to age or medical condition, Ondansetron, Midazolam and Dexamethasone  Airway Management Planned: Oral ETT  Additional Equipment: None  Intra-op Plan:   Post-operative Plan: Extubation in OR  Informed Consent: I have reviewed the patients History and Physical, chart, labs and discussed the procedure including the risks, benefits and alternatives for the proposed anesthesia with the patient or authorized representative who has indicated his/her understanding and acceptance.     Dental advisory given  Plan Discussed with: CRNA and Anesthesiologist  Anesthesia Plan Comments:        Anesthesia Quick Evaluation

## 2019-03-20 ENCOUNTER — Inpatient Hospital Stay (HOSPITAL_COMMUNITY): Payer: BC Managed Care – PPO | Admitting: Anesthesiology

## 2019-03-20 ENCOUNTER — Inpatient Hospital Stay (HOSPITAL_COMMUNITY): Payer: BC Managed Care – PPO | Admitting: Physician Assistant

## 2019-03-20 ENCOUNTER — Inpatient Hospital Stay (HOSPITAL_COMMUNITY)
Admission: RE | Admit: 2019-03-20 | Discharge: 2019-03-26 | DRG: 331 | Disposition: A | Discharge status: Home / Self Care | Payer: BC Managed Care – PPO | Attending: Surgery | Admitting: Surgery

## 2019-03-20 ENCOUNTER — Encounter (HOSPITAL_COMMUNITY): Payer: Self-pay | Admitting: *Deleted

## 2019-03-20 ENCOUNTER — Encounter (HOSPITAL_COMMUNITY)
Admission: RE | Disposition: A | Discharge status: Home / Self Care | Payer: Self-pay | Source: Home / Self Care | Attending: Surgery

## 2019-03-20 DIAGNOSIS — Z432 Encounter for attention to ileostomy: Secondary | ICD-10-CM | POA: Diagnosis not present

## 2019-03-20 DIAGNOSIS — Z720 Tobacco use: Secondary | ICD-10-CM | POA: Diagnosis not present

## 2019-03-20 DIAGNOSIS — E872 Acidosis: Secondary | ICD-10-CM | POA: Diagnosis not present

## 2019-03-20 DIAGNOSIS — Z833 Family history of diabetes mellitus: Secondary | ICD-10-CM | POA: Diagnosis not present

## 2019-03-20 DIAGNOSIS — K660 Peritoneal adhesions (postprocedural) (postinfection): Secondary | ICD-10-CM | POA: Diagnosis present

## 2019-03-20 DIAGNOSIS — L910 Hypertrophic scar: Secondary | ICD-10-CM | POA: Diagnosis not present

## 2019-03-20 DIAGNOSIS — I951 Orthostatic hypotension: Secondary | ICD-10-CM | POA: Diagnosis not present

## 2019-03-20 DIAGNOSIS — K432 Incisional hernia without obstruction or gangrene: Secondary | ICD-10-CM | POA: Diagnosis not present

## 2019-03-20 DIAGNOSIS — Z9889 Other specified postprocedural states: Secondary | ICD-10-CM

## 2019-03-20 HISTORY — PX: LAPAROTOMY: SHX154

## 2019-03-20 HISTORY — PX: LAPAROSCOPIC LYSIS OF ADHESIONS: SHX5905

## 2019-03-20 HISTORY — PX: ILEOSTOMY CLOSURE: SHX1784

## 2019-03-20 LAB — TYPE AND SCREEN
ABO/RH(D): A POS
Antibody Screen: NEGATIVE

## 2019-03-20 SURGERY — LAPAROTOMY, EXPLORATORY
Anesthesia: General | Site: Abdomen

## 2019-03-20 MED ORDER — LIDOCAINE 2% (20 MG/ML) 5 ML SYRINGE
INTRAMUSCULAR | Status: DC | PRN
  Administered 2019-03-20: 1.5 mg/kg/h via INTRAVENOUS

## 2019-03-20 MED ORDER — METOPROLOL TARTRATE 5 MG/5ML IV SOLN: 5.0000 mg | Freq: Four times a day (QID) | INTRAVENOUS | Status: DC | PRN

## 2019-03-20 MED ORDER — DIPHENHYDRAMINE HCL 12.5 MG/5ML PO ELIX: 12.5000 mg | ORAL_SOLUTION | Freq: Four times a day (QID) | ORAL | Status: DC | PRN

## 2019-03-20 MED ORDER — SUFENTANIL CITRATE 50 MCG/ML IV SOLN
INTRAVENOUS | Status: DC | PRN
  Administered 2019-03-20: 10 ug via INTRAVENOUS
  Administered 2019-03-20: 5 ug via INTRAVENOUS
  Administered 2019-03-20: 10 ug via INTRAVENOUS
  Administered 2019-03-20: 5 ug via INTRAVENOUS
  Administered 2019-03-20: 15 ug via INTRAVENOUS
  Administered 2019-03-20: 10 ug via INTRAVENOUS
  Administered 2019-03-20: 5 ug via INTRAVENOUS
  Administered 2019-03-20 (×2): 10 ug via INTRAVENOUS

## 2019-03-20 MED ORDER — LACTATED RINGERS IV SOLN
INTRAVENOUS | Status: DC
  Administered 2019-03-20 (×4): via INTRAVENOUS

## 2019-03-20 MED ORDER — ACETAMINOPHEN 500 MG PO TABS
1000.0000 mg | ORAL_TABLET | Freq: Four times a day (QID) | ORAL | Status: DC
  Administered 2019-03-20 – 2019-03-26 (×15): 1000 mg via ORAL
  Filled 2019-03-20 (×20): qty 2

## 2019-03-20 MED ORDER — PROPOFOL 10 MG/ML IV BOLUS
INTRAVENOUS | Status: AC
  Filled 2019-03-20: qty 20

## 2019-03-20 MED ORDER — SUFENTANIL CITRATE 50 MCG/ML IV SOLN
INTRAVENOUS | Status: AC
  Filled 2019-03-20: qty 1

## 2019-03-20 MED ORDER — MIDAZOLAM HCL 2 MG/2ML IJ SOLN
INTRAMUSCULAR | Status: AC
  Filled 2019-03-20: qty 2

## 2019-03-20 MED ORDER — BUPIVACAINE-EPINEPHRINE 0.25% -1:200000 IJ SOLN
INTRAMUSCULAR | Status: AC
  Filled 2019-03-20: qty 2

## 2019-03-20 MED ORDER — DEXAMETHASONE SODIUM PHOSPHATE 10 MG/ML IJ SOLN
INTRAMUSCULAR | Status: AC
  Filled 2019-03-20: qty 1

## 2019-03-20 MED ORDER — HYDROMORPHONE HCL 1 MG/ML IJ SOLN
INTRAMUSCULAR | Status: DC | PRN
  Administered 2019-03-20: 0.5 mg via INTRAVENOUS

## 2019-03-20 MED ORDER — POLYETHYLENE GLYCOL 3350 17 GM/SCOOP PO POWD: 1.0000 | Freq: Once | ORAL | Status: DC

## 2019-03-20 MED ORDER — ALUM & MAG HYDROXIDE-SIMETH 200-200-20 MG/5ML PO SUSP
30.0000 mL | Freq: Four times a day (QID) | ORAL | Status: DC | PRN
  Administered 2019-03-23: 30 mL via ORAL
  Filled 2019-03-20: qty 30

## 2019-03-20 MED ORDER — PROMETHAZINE HCL 25 MG/ML IJ SOLN: 6.2500 mg | INTRAMUSCULAR | Status: DC | PRN

## 2019-03-20 MED ORDER — LABETALOL HCL 5 MG/ML IV SOLN
INTRAVENOUS | Status: DC | PRN
  Administered 2019-03-20: 5 mg via INTRAVENOUS
  Administered 2019-03-20 (×2): 2.5 mg via INTRAVENOUS

## 2019-03-20 MED ORDER — SUGAMMADEX SODIUM 200 MG/2ML IV SOLN
INTRAVENOUS | Status: DC | PRN
  Administered 2019-03-20: 200 mg via INTRAVENOUS

## 2019-03-20 MED ORDER — ONDANSETRON HCL 4 MG PO TABS: 4.0000 mg | ORAL_TABLET | Freq: Four times a day (QID) | ORAL | Status: DC | PRN

## 2019-03-20 MED ORDER — DIPHENHYDRAMINE HCL 50 MG/ML IJ SOLN: 12.5000 mg | Freq: Four times a day (QID) | INTRAMUSCULAR | Status: DC | PRN

## 2019-03-20 MED ORDER — SODIUM CHLORIDE (PF) 0.9 % IJ SOLN
INTRAMUSCULAR | Status: AC
  Filled 2019-03-20: qty 10

## 2019-03-20 MED ORDER — ROCURONIUM BROMIDE 10 MG/ML (PF) SYRINGE
PREFILLED_SYRINGE | INTRAVENOUS | Status: DC | PRN
  Administered 2019-03-20: 60 mg via INTRAVENOUS
  Administered 2019-03-20 (×2): 20 mg via INTRAVENOUS

## 2019-03-20 MED ORDER — METRONIDAZOLE 500 MG PO TABS: 1000.0000 mg | ORAL_TABLET | ORAL | Status: DC

## 2019-03-20 MED ORDER — HEPARIN SODIUM (PORCINE) 5000 UNIT/ML IJ SOLN
5000.0000 [IU] | Freq: Once | INTRAMUSCULAR | Status: AC
  Administered 2019-03-20: 5000 [IU] via SUBCUTANEOUS
  Filled 2019-03-20: qty 1

## 2019-03-20 MED ORDER — OXYCODONE HCL 5 MG PO TABS: 5.0000 mg | ORAL_TABLET | Freq: Once | ORAL | Status: DC | PRN

## 2019-03-20 MED ORDER — ALVIMOPAN 12 MG PO CAPS
12.0000 mg | ORAL_CAPSULE | ORAL | Status: AC
  Administered 2019-03-20: 12 mg via ORAL
  Filled 2019-03-20: qty 1

## 2019-03-20 MED ORDER — HYDROMORPHONE HCL 1 MG/ML IJ SOLN
0.2500 mg | INTRAMUSCULAR | Status: DC | PRN
  Administered 2019-03-20 (×4): 0.5 mg via INTRAVENOUS

## 2019-03-20 MED ORDER — HYDROMORPHONE HCL 1 MG/ML IJ SOLN
INTRAMUSCULAR | Status: AC
  Filled 2019-03-20: qty 2

## 2019-03-20 MED ORDER — ONDANSETRON HCL 4 MG/2ML IJ SOLN
INTRAMUSCULAR | Status: AC
  Filled 2019-03-20: qty 2

## 2019-03-20 MED ORDER — ONDANSETRON HCL 4 MG/2ML IJ SOLN
4.0000 mg | Freq: Four times a day (QID) | INTRAMUSCULAR | Status: DC | PRN
  Administered 2019-03-20 – 2019-03-24 (×9): 4 mg via INTRAVENOUS
  Filled 2019-03-20 (×10): qty 2

## 2019-03-20 MED ORDER — PROPOFOL 10 MG/ML IV BOLUS
INTRAVENOUS | Status: DC | PRN
  Administered 2019-03-20: 30 mg via INTRAVENOUS
  Administered 2019-03-20: 200 mg via INTRAVENOUS
  Administered 2019-03-20: 40 mg via INTRAVENOUS

## 2019-03-20 MED ORDER — SODIUM CHLORIDE 0.9 % IV SOLN
2.0000 g | INTRAVENOUS | Status: AC
  Administered 2019-03-20: 2 g via INTRAVENOUS
  Filled 2019-03-20: qty 2

## 2019-03-20 MED ORDER — LIDOCAINE HCL 2 % IJ SOLN
INTRAMUSCULAR | Status: AC
  Filled 2019-03-20: qty 20

## 2019-03-20 MED ORDER — ENSURE SURGERY PO LIQD
237.0000 mL | Freq: Two times a day (BID) | ORAL | Status: DC
  Administered 2019-03-21 – 2019-03-22 (×4): 237 mL via ORAL
  Filled 2019-03-20 (×13): qty 237

## 2019-03-20 MED ORDER — OXYCODONE HCL 5 MG/5ML PO SOLN: 5.0000 mg | Freq: Once | ORAL | Status: DC | PRN

## 2019-03-20 MED ORDER — LACTATED RINGERS IV SOLN
INTRAVENOUS | Status: DC
  Administered 2019-03-20 – 2019-03-24 (×5): via INTRAVENOUS

## 2019-03-20 MED ORDER — ONDANSETRON HCL 4 MG/2ML IJ SOLN
INTRAMUSCULAR | Status: DC | PRN
  Administered 2019-03-20: 4 mg via INTRAVENOUS

## 2019-03-20 MED ORDER — KETAMINE HCL 10 MG/ML IJ SOLN
INTRAMUSCULAR | Status: DC | PRN
  Administered 2019-03-20: 40 mg via INTRAVENOUS
  Administered 2019-03-20: 10 mg via INTRAVENOUS

## 2019-03-20 MED ORDER — KETAMINE HCL 10 MG/ML IJ SOLN
INTRAMUSCULAR | Status: AC
  Filled 2019-03-20: qty 1

## 2019-03-20 MED ORDER — FENTANYL CITRATE (PF) 100 MCG/2ML IJ SOLN
25.0000 ug | INTRAMUSCULAR | Status: DC | PRN
  Administered 2019-03-20 (×3): 50 ug via INTRAVENOUS

## 2019-03-20 MED ORDER — IBUPROFEN 200 MG PO TABS
600.0000 mg | ORAL_TABLET | Freq: Four times a day (QID) | ORAL | Status: DC | PRN
  Administered 2019-03-20 – 2019-03-24 (×9): 600 mg via ORAL
  Filled 2019-03-20 (×10): qty 3

## 2019-03-20 MED ORDER — LIDOCAINE 2% (20 MG/ML) 5 ML SYRINGE
INTRAMUSCULAR | Status: DC | PRN
  Administered 2019-03-20: 75 mg via INTRAVENOUS

## 2019-03-20 MED ORDER — HEPARIN SODIUM (PORCINE) 5000 UNIT/ML IJ SOLN
5000.0000 [IU] | Freq: Three times a day (TID) | INTRAMUSCULAR | Status: DC
  Administered 2019-03-20 – 2019-03-26 (×14): 5000 [IU] via SUBCUTANEOUS
  Filled 2019-03-20 (×16): qty 1

## 2019-03-20 MED ORDER — MIDAZOLAM HCL 5 MG/5ML IJ SOLN
INTRAMUSCULAR | Status: DC | PRN
  Administered 2019-03-20: 1 mg via INTRAVENOUS
  Administered 2019-03-20: 2 mg via INTRAVENOUS

## 2019-03-20 MED ORDER — HYDROMORPHONE HCL 1 MG/ML IJ SOLN
0.5000 mg | INTRAMUSCULAR | Status: DC | PRN
  Administered 2019-03-20 – 2019-03-25 (×19): 0.5 mg via INTRAVENOUS
  Filled 2019-03-20 (×20): qty 0.5

## 2019-03-20 MED ORDER — OXYCODONE HCL 5 MG PO TABS
5.0000 mg | ORAL_TABLET | Freq: Four times a day (QID) | ORAL | Status: DC | PRN
  Administered 2019-03-20 – 2019-03-25 (×12): 5 mg via ORAL
  Filled 2019-03-20 (×13): qty 1

## 2019-03-20 MED ORDER — 0.9 % SODIUM CHLORIDE (POUR BTL) OPTIME
TOPICAL | Status: DC | PRN
  Administered 2019-03-20: 2000 mL

## 2019-03-20 MED ORDER — HYDROMORPHONE HCL 2 MG/ML IJ SOLN
INTRAMUSCULAR | Status: AC
  Filled 2019-03-20: qty 1

## 2019-03-20 MED ORDER — FENTANYL CITRATE (PF) 100 MCG/2ML IJ SOLN
INTRAMUSCULAR | Status: AC
  Filled 2019-03-20: qty 2

## 2019-03-20 MED ORDER — NEOMYCIN SULFATE 500 MG PO TABS: 1000.0000 mg | ORAL_TABLET | ORAL | Status: DC

## 2019-03-20 MED ORDER — DEXAMETHASONE SODIUM PHOSPHATE 10 MG/ML IJ SOLN
INTRAMUSCULAR | Status: DC | PRN
  Administered 2019-03-20: 10 mg via INTRAVENOUS

## 2019-03-20 MED ORDER — ACETAMINOPHEN 500 MG PO TABS
1000.0000 mg | ORAL_TABLET | ORAL | Status: AC
  Administered 2019-03-20: 1000 mg via ORAL
  Filled 2019-03-20: qty 2

## 2019-03-20 MED ORDER — ALVIMOPAN 12 MG PO CAPS
12.0000 mg | ORAL_CAPSULE | Freq: Two times a day (BID) | ORAL | Status: DC
  Administered 2019-03-21: 12 mg via ORAL
  Filled 2019-03-20: qty 1

## 2019-03-20 SURGICAL SUPPLY — 70 items
BLADE EXTENDED COATED 6.5IN (ELECTRODE) ×3 IMPLANT
BLADE HEX COATED 2.75 (ELECTRODE) ×3 IMPLANT
BNDG GAUZE ELAST 4 BULKY (GAUZE/BANDAGES/DRESSINGS) ×3 IMPLANT
CHLORAPREP W/TINT 26 (MISCELLANEOUS) ×3 IMPLANT
COVER MAYO STAND STRL (DRAPES) ×3 IMPLANT
COVER WAND RF STERILE (DRAPES) IMPLANT
DRAIN CHANNEL 19F RND (DRAIN) IMPLANT
DRAPE LAPAROSCOPIC ABDOMINAL (DRAPES) ×3 IMPLANT
DRAPE SHEET LG 3/4 BI-LAMINATE (DRAPES) IMPLANT
DRAPE UTILITY XL STRL (DRAPES) ×3 IMPLANT
DRAPE WARM FLUID 44X44 (DRAPES) ×3 IMPLANT
DRSG OPSITE POSTOP 4X10 (GAUZE/BANDAGES/DRESSINGS) IMPLANT
DRSG OPSITE POSTOP 4X6 (GAUZE/BANDAGES/DRESSINGS) IMPLANT
DRSG OPSITE POSTOP 4X8 (GAUZE/BANDAGES/DRESSINGS) IMPLANT
DRSG PAD ABDOMINAL 8X10 ST (GAUZE/BANDAGES/DRESSINGS) ×3 IMPLANT
DRSG TELFA 3X8 NADH (GAUZE/BANDAGES/DRESSINGS) IMPLANT
ELECT BLADE TIP CTD 4 INCH (ELECTRODE) ×3 IMPLANT
ELECT REM PT RETURN 15FT ADLT (MISCELLANEOUS) ×3 IMPLANT
EVACUATOR SILICONE 100CC (DRAIN) IMPLANT
GAUZE SPONGE 4X4 12PLY STRL (GAUZE/BANDAGES/DRESSINGS) ×3 IMPLANT
GLOVE BIOGEL PI IND STRL 6.5 (GLOVE) ×4 IMPLANT
GLOVE BIOGEL PI IND STRL 7.0 (GLOVE) ×6 IMPLANT
GLOVE BIOGEL PI INDICATOR 6.5 (GLOVE) ×2
GLOVE BIOGEL PI INDICATOR 7.0 (GLOVE) ×3
GLOVE ECLIPSE 8.0 STRL XLNG CF (GLOVE) ×6 IMPLANT
GLOVE INDICATOR 8.0 STRL GRN (GLOVE) ×3 IMPLANT
GLOVE SURG SS PI 7.0 STRL IVOR (GLOVE) ×6 IMPLANT
GOWN STRL REUS W/TWL LRG LVL3 (GOWN DISPOSABLE) ×9 IMPLANT
GOWN STRL REUS W/TWL XL LVL3 (GOWN DISPOSABLE) ×6 IMPLANT
HANDLE SUCTION POOLE (INSTRUMENTS) ×2 IMPLANT
HOLDER FOLEY CATH W/STRAP (MISCELLANEOUS) ×3 IMPLANT
KIT BASIN OR (CUSTOM PROCEDURE TRAY) ×3 IMPLANT
KIT TURNOVER KIT A (KITS) IMPLANT
LEGGING LITHOTOMY PAIR STRL (DRAPES) ×3 IMPLANT
LIGASURE IMPACT 36 18CM CVD LR (INSTRUMENTS) ×3 IMPLANT
MANIFOLD NEPTUNE II (INSTRUMENTS) ×3 IMPLANT
PACK GENERAL/GYN (CUSTOM PROCEDURE TRAY) ×3 IMPLANT
PAD POSITIONING PINK XL (MISCELLANEOUS) ×3 IMPLANT
RELOAD PROXIMATE 75MM BLUE (ENDOMECHANICALS) ×6 IMPLANT
SPONGE LAP 18X18 RF (DISPOSABLE) IMPLANT
STAPLER GUN LINEAR PROX 60 (STAPLE) IMPLANT
STAPLER PROXIMATE 75MM BLUE (STAPLE) ×3 IMPLANT
STAPLER VISISTAT 35W (STAPLE) ×3 IMPLANT
SUCTION POOLE HANDLE (INSTRUMENTS) ×3
SUT ETHILON 3 0 PS 1 (SUTURE) IMPLANT
SUT NOVA 1 T20/GS 25DT (SUTURE) IMPLANT
SUT NOVA NAB DX-16 0-1 5-0 T12 (SUTURE) IMPLANT
SUT NOVA NAB GS-21 0 18 T12 DT (SUTURE) ×6 IMPLANT
SUT PDS AB 1 CTX 36 (SUTURE) ×3 IMPLANT
SUT PDS AB 1 TP1 96 (SUTURE) ×6 IMPLANT
SUT PDS AB 3-0 SH 27 (SUTURE) ×3 IMPLANT
SUT PROLENE 2 0 BLUE (SUTURE) IMPLANT
SUT SILK 2 0 (SUTURE) ×1
SUT SILK 2 0 SH CR/8 (SUTURE) ×3 IMPLANT
SUT SILK 2-0 18XBRD TIE 12 (SUTURE) ×2 IMPLANT
SUT SILK 3 0 (SUTURE) ×1
SUT SILK 3 0 SH CR/8 (SUTURE) ×3 IMPLANT
SUT SILK 3-0 18XBRD TIE 12 (SUTURE) ×2 IMPLANT
SUT VIC AB 2-0 SH 18 (SUTURE) IMPLANT
SUT VIC AB 2-0 SH 27 (SUTURE) ×2
SUT VIC AB 2-0 SH 27X BRD (SUTURE) ×4 IMPLANT
SUT VIC AB 3-0 SH 18 (SUTURE) IMPLANT
SUT VICRYL 0 UR6 27IN ABS (SUTURE) ×3 IMPLANT
SUT VICRYL 4-0 PS2 18IN ABS (SUTURE) ×3 IMPLANT
TAPE CLOTH SURG 6X10 WHT LF (GAUZE/BANDAGES/DRESSINGS) ×3 IMPLANT
TOWEL OR 17X26 10 PK STRL BLUE (TOWEL DISPOSABLE) ×9 IMPLANT
TOWEL OR NON WOVEN STRL DISP B (DISPOSABLE) ×6 IMPLANT
TRAY FOLEY MTR SLVR 14FR STAT (SET/KITS/TRAYS/PACK) IMPLANT
TRAY FOLEY MTR SLVR 16FR STAT (SET/KITS/TRAYS/PACK) ×3 IMPLANT
YANKAUER SUCT BULB TIP NO VENT (SUCTIONS) ×3 IMPLANT
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
## 2019-03-20 NOTE — Anesthesia Postprocedure Evaluation (Signed)
Anesthesia Post Note  Patient: Richard E Ohm Jr.  Procedure(s) Performed: EXPLORATORY LAPAROTOMY (N/A Abdomen) TAKEDOWN OF END ILEOSTOMY (N/A Abdomen) LAPAROSCOPIC LYSIS OF ADHESIONS (Abdomen)     Patient location during evaluation: PACU Anesthesia Type: General Level of consciousness: awake Pain management: pain level controlled Vital Signs Assessment: post-procedure vital signs reviewed and stable Respiratory status: spontaneous breathing, nonlabored ventilation, respiratory function stable and patient connected to nasal cannula oxygen Cardiovascular status: blood pressure returned to baseline and stable Postop Assessment: no apparent nausea or vomiting Anesthetic complications: no    Last Vitals:  Vitals:   03/20/19 1300 03/20/19 1315  BP: 138/85   Pulse: (!) 108 95  Resp: (!) 26 10  Temp:    SpO2: 92% 96%    Last Pain:  Vitals:   03/20/19 1315  TempSrc:   PainSc: Dennard Brock

## 2019-03-20 NOTE — Progress Notes (Signed)
PT Cancellation Note  Patient Details Name: Richard E Schuermann Jr. MRN: [id number] DOB: 01-06-1996   Cancelled Treatment:    Reason Eval/Treat Not Completed: Pain limiting ability to participate    PT orders received and chart reviewed.  Noted pt had sx today with activity orders to begin within 4 hours (entered 1355).  Checked at 1630 and pt declined PT services due to pain.  Will f/u as able.  Notified nursing.    Mikael Spray Darnel Mchan 03/20/2019, 4:28 PM
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
## 2019-03-20 NOTE — Progress Notes (Signed)
May give Dilaudid per [facility] protocol - per Dr Fransisco Beau

## 2019-03-20 NOTE — Anesthesia Procedure Notes (Signed)
Procedure Name: Intubation Date/Time: 03/20/2019 8:34 AM Performed by: Lissa Morales, CRNA Pre-anesthesia Checklist: Patient identified, Emergency Drugs available, Suction available and Patient being monitored Patient Re-evaluated:Patient Re-evaluated prior to induction Oxygen Delivery Method: Circle system utilized Preoxygenation: Pre-oxygenation with 100% oxygen Induction Type: IV induction Ventilation: Mask ventilation without difficulty Laryngoscope Size: Mac and 4 Grade View: Grade I Tube type: Oral Tube size: 7.5 mm Number of attempts: 1 Airway Equipment and Method: Stylet and Oral airway Placement Confirmation: ETT inserted through vocal cords under direct vision,  positive ETCO2 and breath sounds checked- equal and bilateral Secured at: 21 cm Tube secured with: Tape Dental Injury: Teeth and Oropharynx as per pre-operative assessment

## 2019-03-20 NOTE — Transfer of Care (Signed)
Immediate Anesthesia Transfer of Care Note  Patient: Mali E Mones Jr.  Procedure(s) Performed: EXPLORATORY LAPAROTOMY (N/A Abdomen) TAKEDOWN OF END ILEOSTOMY (N/A Abdomen) LAPAROSCOPIC LYSIS OF ADHESIONS (Abdomen)  Patient Location: PACU  Anesthesia Type:General  Level of Consciousness: awake, alert , oriented and patient cooperative  Airway & Oxygen Therapy: Patient Spontanous Breathing and Patient connected to face mask oxygen  Post-op Assessment: Report given to RN, Post -op Vital signs reviewed and stable and Patient moving all extremities X 4  Post vital signs: stable  Last Vitals:  Vitals Value Taken Time  BP 156/94 03/20/19 1146  Temp    Pulse 103 03/20/19 1150  Resp 18 03/20/19 1150  SpO2 97 % 03/20/19 1150  Vitals shown include unvalidated device data.  Last Pain:  Vitals:   03/20/19 0701  TempSrc: Oral         Complications: No apparent anesthesia complications

## 2019-03-21 ENCOUNTER — Other Ambulatory Visit: Payer: Self-pay

## 2019-03-21 ENCOUNTER — Encounter (HOSPITAL_COMMUNITY): Payer: Self-pay | Admitting: Surgery

## 2019-03-21 LAB — COMPREHENSIVE METABOLIC PANEL
ALT: 30 U/L (ref 0–44)
AST: 21 U/L (ref 15–41)
Albumin: 3.3 g/dL — ABNORMAL LOW (ref 3.5–5.0)
Alkaline Phosphatase: 91 U/L (ref 38–126)
Anion gap: 11 (ref 5–15)
BUN: 13 mg/dL (ref 6–20)
CO2: 24 mmol/L (ref 22–32)
Calcium: 8.7 mg/dL — ABNORMAL LOW (ref 8.9–10.3)
Chloride: 102 mmol/L (ref 98–111)
Creatinine, Ser: 0.79 mg/dL (ref 0.61–1.24)
GFR calc Af Amer: >60 mL/min (ref >60)
GFR calc non Af Amer: >60 mL/min (ref >60)
Glucose, Bld: 123 mg/dL — ABNORMAL HIGH (ref 70–99)
Potassium: 3.9 mmol/L (ref 3.5–5.1)
Sodium: 137 mmol/L (ref 135–145)
Total Bilirubin: 0.7 mg/dL (ref 0.3–1.2)
Total Protein: 5.9 g/dL — ABNORMAL LOW (ref 6.5–8.1)

## 2019-03-21 LAB — CBC
HCT: 44.1 % (ref 39.0–52.0)
HCT: 42.2 % (ref 39.0–52.0)
Hemoglobin: 14.7 g/dL (ref 13.0–17.0)
Hemoglobin: 13.9 g/dL (ref 13.0–17.0)
MCH: 28.9 pg (ref 26.0–34.0)
MCH: 28.7 pg (ref 26.0–34.0)
MCHC: 33.3 g/dL (ref 30.0–36.0)
MCHC: 32.9 g/dL (ref 30.0–36.0)
MCV: 86.6 fL (ref 80.0–100.0)
MCV: 87.2 fL (ref 80.0–100.0)
Platelets: 296 10*3/uL (ref 150–400)
Platelets: 285 10*3/uL (ref 150–400)
RBC: 5.09 MIL/uL (ref 4.22–5.81)
RBC: 4.84 MIL/uL (ref 4.22–5.81)
RDW: 12.9 % (ref 11.5–15.5)
RDW: 13.1 % (ref 11.5–15.5)
WBC: 17.7 10*3/uL — ABNORMAL HIGH (ref 4.0–10.5)
WBC: 13.5 10*3/uL — ABNORMAL HIGH (ref 4.0–10.5)
nRBC: 0 % (ref 0.0–0.2)
nRBC: 0 % (ref 0.0–0.2)

## 2019-03-21 LAB — BASIC METABOLIC PANEL
Anion gap: 14 (ref 5–15)
BUN: 14 mg/dL (ref 6–20)
CO2: 23 mmol/L (ref 22–32)
Calcium: 8.4 mg/dL — ABNORMAL LOW (ref 8.9–10.3)
Chloride: 99 mmol/L (ref 98–111)
Creatinine, Ser: 0.9 mg/dL (ref 0.61–1.24)
GFR calc Af Amer: >60 mL/min (ref >60)
GFR calc non Af Amer: >60 mL/min (ref >60)
Glucose, Bld: 131 mg/dL — ABNORMAL HIGH (ref 70–99)
Potassium: 4.3 mmol/L (ref 3.5–5.1)
Sodium: 136 mmol/L (ref 135–145)

## 2019-03-21 LAB — MAGNESIUM: Magnesium: 1.7 mg/dL (ref 1.7–2.4)

## 2019-03-21 LAB — PHOSPHORUS: Phosphorus: 3.1 mg/dL (ref 2.5–4.6)

## 2019-03-21 LAB — SURGICAL PATHOLOGY

## 2019-03-21 MED ORDER — ADULT MULTIVITAMIN W/MINERALS CH
1.0000 | ORAL_TABLET | Freq: Every day | ORAL | Status: DC
  Administered 2019-03-21 – 2019-03-25 (×3): 1 via ORAL
  Filled 2019-03-21 (×6): qty 1

## 2019-03-21 MED ORDER — LORAZEPAM 1 MG PO TABS: 1.0000 mg | ORAL_TABLET | ORAL | Status: AC | PRN

## 2019-03-21 MED ORDER — LORAZEPAM 2 MG/ML IJ SOLN: 1.0000 mg | INTRAMUSCULAR | Status: AC | PRN

## 2019-03-21 MED ORDER — GABAPENTIN 300 MG PO CAPS
300.0000 mg | ORAL_CAPSULE | Freq: Three times a day (TID) | ORAL | Status: DC
  Administered 2019-03-21 – 2019-03-25 (×13): 300 mg via ORAL
  Filled 2019-03-21 (×14): qty 1

## 2019-03-21 MED ORDER — LACTATED RINGERS IV BOLUS
500.0000 mL | Freq: Once | INTRAVENOUS | Status: AC
  Administered 2019-03-21: 500 mL via INTRAVENOUS

## 2019-03-21 MED ORDER — FOLIC ACID 1 MG PO TABS
1.0000 mg | ORAL_TABLET | Freq: Every day | ORAL | Status: DC
  Administered 2019-03-21 – 2019-03-25 (×4): 1 mg via ORAL
  Filled 2019-03-21 (×5): qty 1

## 2019-03-21 MED ORDER — VITAMIN B-1 100 MG PO TABS
100.0000 mg | ORAL_TABLET | Freq: Every day | ORAL | Status: DC
  Administered 2019-03-21 – 2019-03-25 (×4): 100 mg via ORAL
  Filled 2019-03-21 (×6): qty 1

## 2019-03-21 MED ORDER — THIAMINE HCL 100 MG/ML IJ SOLN: 100.0000 mg | Freq: Every day | INTRAMUSCULAR | Status: DC
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
## 2019-03-21 NOTE — Progress Notes (Signed)
RN instructed patient this am about discontinuing his foley catheter, patient refused stated "I need my IV pain medicine if you do that,I've been there before I know how it feels" . We will relay to day shift RN .We will continue to monitor
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
## 2019-03-21 NOTE — Progress Notes (Signed)
Pt not really taking in much in the way of liquids.  Also he has been urinating but not in the urinal, he was encouraged to do so.  Dressing had to be reinforced this afternoon.

## 2019-03-22 LAB — CBC
HCT: 43.5 % (ref 39.0–52.0)
Hemoglobin: 13.8 g/dL (ref 13.0–17.0)
MCH: 28.5 pg (ref 26.0–34.0)
MCHC: 31.7 g/dL (ref 30.0–36.0)
MCV: 89.7 fL (ref 80.0–100.0)
Platelets: 235 10*3/uL (ref 150–400)
RBC: 4.85 MIL/uL (ref 4.22–5.81)
RDW: 13.2 % (ref 11.5–15.5)
WBC: 11.5 10*3/uL — ABNORMAL HIGH (ref 4.0–10.5)
nRBC: 0 % (ref 0.0–0.2)

## 2019-03-22 LAB — BASIC METABOLIC PANEL
Anion gap: 8 (ref 5–15)
BUN: 8 mg/dL (ref 6–20)
CO2: 30 mmol/L (ref 22–32)
Calcium: 8.5 mg/dL — ABNORMAL LOW (ref 8.9–10.3)
Chloride: 102 mmol/L (ref 98–111)
Creatinine, Ser: 0.81 mg/dL (ref 0.61–1.24)
GFR calc Af Amer: >60 mL/min (ref >60)
GFR calc non Af Amer: >60 mL/min (ref >60)
Glucose, Bld: 106 mg/dL — ABNORMAL HIGH (ref 70–99)
Potassium: 3.6 mmol/L (ref 3.5–5.1)
Sodium: 140 mmol/L (ref 135–145)

## 2019-03-22 MED ORDER — PROMETHAZINE HCL 25 MG/ML IJ SOLN
12.5000 mg | Freq: Four times a day (QID) | INTRAMUSCULAR | Status: DC | PRN
  Administered 2019-03-22 – 2019-03-24 (×5): 12.5 mg via INTRAVENOUS
  Filled 2019-03-22 (×4): qty 1
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
## 2019-03-23 LAB — CBC WITH DIFFERENTIAL/PLATELET
Abs Immature Granulocytes: 0.04 10*3/uL (ref 0.00–0.07)
Basophils Absolute: 0 10*3/uL (ref 0.0–0.1)
Basophils Relative: 0 %
Eosinophils Absolute: 0.1 10*3/uL (ref 0.0–0.5)
Eosinophils Relative: 1 %
HCT: 43.9 % (ref 39.0–52.0)
Hemoglobin: 14 g/dL (ref 13.0–17.0)
Immature Granulocytes: 1 %
Lymphocytes Relative: 13 %
Lymphs Abs: 1.1 10*3/uL (ref 0.7–4.0)
MCH: 28.6 pg (ref 26.0–34.0)
MCHC: 31.9 g/dL (ref 30.0–36.0)
MCV: 89.6 fL (ref 80.0–100.0)
Monocytes Absolute: 0.9 10*3/uL (ref 0.1–1.0)
Monocytes Relative: 10 %
Neutro Abs: 6.5 10*3/uL (ref 1.7–7.7)
Neutrophils Relative %: 75 %
Platelets: 234 10*3/uL (ref 150–400)
RBC: 4.9 MIL/uL (ref 4.22–5.81)
RDW: 13.2 % (ref 11.5–15.5)
WBC: 8.7 10*3/uL (ref 4.0–10.5)
nRBC: 0 % (ref 0.0–0.2)

## 2019-03-23 LAB — BASIC METABOLIC PANEL
Anion gap: 9 (ref 5–15)
BUN: 9 mg/dL (ref 6–20)
CO2: 30 mmol/L (ref 22–32)
Calcium: 8.8 mg/dL — ABNORMAL LOW (ref 8.9–10.3)
Chloride: 101 mmol/L (ref 98–111)
Creatinine, Ser: 0.77 mg/dL (ref 0.61–1.24)
GFR calc Af Amer: >60 mL/min (ref >60)
GFR calc non Af Amer: >60 mL/min (ref >60)
Glucose, Bld: 114 mg/dL — ABNORMAL HIGH (ref 70–99)
Potassium: 3.9 mmol/L (ref 3.5–5.1)
Sodium: 140 mmol/L (ref 135–145)
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
## 2019-03-23 NOTE — Progress Notes (Signed)
Patient is refusing his scheduled tylenol despite RN's efforts to educate on the benefits of non-opoid pain medication and the need to manage pain with Tylenol before taking stronger pain medication.

## 2019-03-24 NOTE — Progress Notes (Signed)
[facility] Office:  [phone] General Surgery Progress Note   LOS: 4 days  POD -  4 Days Post-Op  Assessment and Plan: 1.  EXPLORATORY LAPAROTOMY, TAKEDOWN OF END ILEOSTOMY, LAPAROSCOPIC LYSIS OF ADHESIONS - 03/20/2019 - [place]  Vomited last night.  Having BM's.  Continue IVF, encourage ambulation, not ready to go home at this point 2.  On CIWA 3.  DVT prophylaxis - SQ Heparin   Active Problems:   S/P exploratory laparotomy  Subjective:  Vomited last night, but having BM's.  He said that his abdomen is very sore from vomiting.  Objective:   Vitals:   03/24/19 0007 03/24/19 0617  BP: (!) 153/96 (!) 146/88  Pulse: (!) 106 94  Resp: 18 18  Temp: 99.5 F (37.5 C) 98.8 F (37.1 C)  SpO2: 97% 97%     Intake/Output from previous day:  11/21 0701 - 11/22 0700 In: 1124.2 [P.O.:840; I.V.:284.2] Out: 1125 [Urine:1125]  Intake/Output this shift:  No intake/output data recorded.   Physical Exam:   General: Bearded WM who is alert and oriented.    HEENT: Normal. Pupils equal. .   Lungs: Clear   Abdomen: Distended with rare BS.  No localized tenderness but diffusely sore.   Wound: Clean.   Lab Results:    Recent Labs    03/22/19 0313 03/23/19 0341  WBC 11.5* 8.7  HGB 13.8 14.0  HCT 43.5 43.9  PLT 235 234    BMET   Recent Labs    03/22/19 0313 03/23/19 0341  NA 140 140  K 3.6 3.9  CL 102 101  CO2 30 30  GLUCOSE 106* 114*  BUN 8 9  CREATININE 0.81 0.77  CALCIUM 8.5* 8.8*    PT/INR  No results for input(s): LABPROT, INR in the last 72 hours.  ABG  No results for input(s): PHART, HCO3 in the last 72 hours.  Invalid input(s): PCO2, PO2   Studies/Results:  No results found.   Anti-infectives:   Anti-infectives (From admission, onward)   Start     Dose/Rate Route Frequency Ordered Stop   03/20/19 1400  neomycin (MYCIFRADIN) tablet 1,000 mg  Status:  Discontinued     1,000 mg Oral 3 times per day 03/20/19 0624 03/20/19 0627   03/20/19  1400  metroNIDAZOLE (FLAGYL) tablet 1,000 mg  Status:  Discontinued     1,000 mg Oral 3 times per day 03/20/19 0624 03/20/19 0627   03/20/19 0630  cefoTEtan (CEFOTAN) 2 g in sodium chloride 0.9 % 100 mL IVPB     2 g 200 mL/hr over 30 Minutes Intravenous On call to O.R. 03/20/19 0624 03/20/19 0825      Alphonsa Overall, MD, [facility] Office: [phone] 03/24/2019
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
## 2019-03-26 MED ORDER — OXYCODONE HCL 5 MG PO TABS: 5.0000 mg | ORAL_TABLET | Freq: Four times a day (QID) | ORAL | 0 refills | Status: AC | PRN

## 2019-03-26 NOTE — Progress Notes (Signed)
Discharge and medication instructions reviewed with patient. Questions answered and patient denies further questions. No prescriptions given. Family member en route to drive patient home. Anola Mcgough, RN 
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
## 2019-03-26 NOTE — Discharge Instructions (Signed)
POST OP INSTRUCTIONS AFTER COLON SURGERY  1. DIET: Be sure to include lots of fluids daily to stay hydrated - 64oz of water per day (8, 8 oz glasses).  Avoid fast food or heavy meals for the first couple of weeks as your are more likely to get nauseated. Avoid raw/uncooked fruits or vegetables for the first 4 weeks (its ok to have these if they are blended into smoothie form).  2. Take your usually prescribed home medications unless otherwise directed.  3. PAIN CONTROL: a. Pain is best controlled by a usual combination of three different methods TOGETHER: i. Ice/Heat ii. Over the counter pain medication iii. Prescription pain medication b. Most patients will experience some swelling and bruising around the surgical site.  Ice packs or heating pads (30-60 minutes up to 6 times a day) will help. Some people prefer to use ice alone, heat alone, alternating between ice & heat.  Experiment to what works for you.  Swelling and bruising can take several weeks to resolve.   c. It is helpful to take an over-the-counter pain medication regularly for the first few weeks: i. Ibuprofen (Motrin/Advil) - 200mg  tabs - take 3 tabs (600mg ) every 6 hours as needed for pain (unless you have been directed previously to avoid NSAIDs/ibuprofen) ii. Acetaminophen (Tylenol) - you may take 650mg  every 6 hours as needed. You can take this with motrin as they act differently on the body. If you are taking a narcotic pain medication that has acetaminophen in it, do not take over the counter tylenol at the same time. iii. NOTE: You may take both of these medications together - most patients  find it most helpful when alternating between the two (i.e. Ibuprofen at 6am, tylenol at 9am, ibuprofen at 12pm ...) d. A  prescription for pain medication should be given to you upon discharge.  Take your pain medication as prescribed if your pain is not adequatly controlled with the over-the-counter pain reliefs mentioned above.  4. To  aid in diarrhea/loose stool: You may start taking a fiber supplement which is over the counter (Metamucil, benefiber, etc) - this may be purchased in the GI health section at the store or pharmacy. Take 1-2 tablespoons of this dissolved in water per day. If after 3-4 days, issues continue with loose stool, you may take Imodium - 1 tablet every 8 hours as needed to start. Do not take more than 3 tablets in 24 hours initially.  5. Dressing: Continue wet to dry dressing changes as instructed in the hospital. It is ok to get soap/water over your wounds in the shower. Change dressing daily. The gauze may be purchased at the pharmacy as well - this is available over the counter and the supplies we used are called "4 x 4 gauze" , ABD Pad (to cover) and tape.  6. ACTIVITIES as tolerated:   a. Avoid heavy lifting (>10lbs or 1 gallon of milk) for the next 6 weeks. b. You may resume regular daily activities as tolerated--such as daily self-care, walking, climbing stairs--gradually increasing activities as tolerated.  If you can walk 30 minutes without difficulty, it is safe to try more intense activity such as jogging, treadmill, bicycling, low-impact aerobics.  c. DO NOT PUSH THROUGH PAIN.  Let pain be your guide: If it hurts to do something, don't do it. d. Dennis Bast may drive when you are no longer taking prescription pain medication, you can comfortably wear a seatbelt, and you can safely maneuver your car and apply brakes.  7. FOLLOW UP in our office a. Please call [facility] at [phone] to set up an appointment to see your surgeon in the office for a follow-up appointment approximately 2 weeks after your surgery. b. Make sure that you call for this appointment the day you arrive home to insure a convenient appointment time.  9. If you have disability or family leave forms that need to be completed, you may have them completed by your primary care physician's office; for return to work instructions, please ask our  office staff and they will be happy to assist you in obtaining this documentation   When to call us [phone]: 1. Poor pain control 2. Reactions / problems with new medications (rash/itching, etc)  3. Fever over 101.5 F (38.5 C) 4. Inability to urinate 5. Nausea/vomiting 6. Worsening swelling or bruising 7. Continued bleeding from incision. 8. Increased pain, redness, or drainage from the incision  The clinic staff is available to answer your questions during regular business hours (8:30am-5pm).  Please dont hesitate to call and ask to speak to one of our nurses for clinical concerns.   A surgeon from [facility] is always on call at the hospitals   If you have a medical emergency, go to the nearest emergency room or call 911.  [facility] [address], [place], [place]  [address] MAIN: [phone] FAX: [phone] [email or web address]
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
## 2019-06-26 DIAGNOSIS — Z9889 Other specified postprocedural states: Secondary | ICD-10-CM | POA: Diagnosis not present

## 2019-07-26 ENCOUNTER — Other Ambulatory Visit: Payer: Self-pay | Admitting: Student

## 2019-07-26 ENCOUNTER — Other Ambulatory Visit (HOSPITAL_COMMUNITY): Payer: Self-pay | Admitting: Student

## 2019-07-26 DIAGNOSIS — Z9889 Other specified postprocedural states: Secondary | ICD-10-CM | POA: Diagnosis not present

## 2019-07-26 DIAGNOSIS — R109 Unspecified abdominal pain: Secondary | ICD-10-CM

## 2019-07-26 DIAGNOSIS — Z09 Encounter for follow-up examination after completed treatment for conditions other than malignant neoplasm: Secondary | ICD-10-CM | POA: Diagnosis not present

## 2019-07-30 ENCOUNTER — Ambulatory Visit (HOSPITAL_COMMUNITY): Payer: BC Managed Care – PPO

## 2019-07-30 ENCOUNTER — Other Ambulatory Visit (HOSPITAL_COMMUNITY): Payer: BC Managed Care – PPO

## 2019-08-07 ENCOUNTER — Other Ambulatory Visit (HOSPITAL_COMMUNITY): Payer: BC Managed Care – PPO

## 2019-08-07 ENCOUNTER — Ambulatory Visit (HOSPITAL_COMMUNITY)
Admission: RE | Admit: 2019-08-07 | Discharge: 2019-08-07 | Disposition: A | Discharge status: Home / Self Care | Payer: BLUE CROSS/BLUE SHIELD | Source: Ambulatory Visit | Attending: Student | Admitting: Student

## 2019-08-07 ENCOUNTER — Other Ambulatory Visit: Payer: Self-pay

## 2019-08-07 ENCOUNTER — Encounter (HOSPITAL_COMMUNITY): Payer: Self-pay

## 2019-08-07 DIAGNOSIS — R109 Unspecified abdominal pain: Secondary | ICD-10-CM | POA: Insufficient documentation

## 2019-08-07 MED ORDER — SODIUM CHLORIDE (PF) 0.9 % IJ SOLN
INTRAMUSCULAR | Status: AC
  Filled 2019-08-07: qty 50

## 2019-08-07 MED ORDER — IOHEXOL 300 MG/ML  SOLN
100.0000 mL | Freq: Once | INTRAMUSCULAR | Status: AC | PRN
  Administered 2019-08-07: 14:00:00 100 mL via INTRAVENOUS

## 2019-09-13 ENCOUNTER — Other Ambulatory Visit: Payer: Self-pay

## 2019-09-13 ENCOUNTER — Emergency Department (HOSPITAL_COMMUNITY)
Admission: EM | Admit: 2019-09-13 | Discharge: 2019-09-13 | Disposition: A | Discharge status: Home / Self Care | Payer: BLUE CROSS/BLUE SHIELD | Attending: Emergency Medicine | Admitting: Emergency Medicine

## 2019-09-13 ENCOUNTER — Encounter (HOSPITAL_COMMUNITY): Payer: Self-pay

## 2019-09-13 ENCOUNTER — Emergency Department (HOSPITAL_COMMUNITY): Payer: BLUE CROSS/BLUE SHIELD

## 2019-09-13 DIAGNOSIS — K8020 Calculus of gallbladder without cholecystitis without obstruction: Secondary | ICD-10-CM | POA: Diagnosis not present

## 2019-09-13 DIAGNOSIS — R109 Unspecified abdominal pain: Secondary | ICD-10-CM | POA: Diagnosis not present

## 2019-09-13 DIAGNOSIS — F17220 Nicotine dependence, chewing tobacco, uncomplicated: Secondary | ICD-10-CM | POA: Diagnosis not present

## 2019-09-13 DIAGNOSIS — R1013 Epigastric pain: Secondary | ICD-10-CM | POA: Diagnosis not present

## 2019-09-13 DIAGNOSIS — R197 Diarrhea, unspecified: Secondary | ICD-10-CM | POA: Diagnosis not present

## 2019-09-13 HISTORY — DX: Calculus of gallbladder without cholecystitis without obstruction: K80.20

## 2019-09-13 LAB — COMPREHENSIVE METABOLIC PANEL
ALT: 20 U/L (ref 0–44)
AST: 20 U/L (ref 15–41)
Albumin: 4.3 g/dL (ref 3.5–5.0)
Alkaline Phosphatase: 106 U/L (ref 38–126)
Anion gap: 8 (ref 5–15)
BUN: 9 mg/dL (ref 6–20)
CO2: 27 mmol/L (ref 22–32)
Calcium: 9 mg/dL (ref 8.9–10.3)
Chloride: 109 mmol/L (ref 98–111)
Creatinine, Ser: 0.91 mg/dL (ref 0.61–1.24)
GFR calc Af Amer: >60 mL/min (ref >60)
GFR calc non Af Amer: >60 mL/min (ref >60)
Glucose, Bld: 92 mg/dL (ref 70–99)
Potassium: 3 mmol/L — ABNORMAL LOW (ref 3.5–5.1)
Sodium: 144 mmol/L (ref 135–145)
Total Bilirubin: 1.7 mg/dL — ABNORMAL HIGH (ref 0.3–1.2)
Total Protein: 6.9 g/dL (ref 6.5–8.1)

## 2019-09-13 LAB — CBC
HCT: 44.3 % (ref 39.0–52.0)
Hemoglobin: 14.6 g/dL (ref 13.0–17.0)
MCH: 28.2 pg (ref 26.0–34.0)
MCHC: 33 g/dL (ref 30.0–36.0)
MCV: 85.5 fL (ref 80.0–100.0)
Platelets: 262 10*3/uL (ref 150–400)
RBC: 5.18 MIL/uL (ref 4.22–5.81)
RDW: 13.5 % (ref 11.5–15.5)
WBC: 5.3 10*3/uL (ref 4.0–10.5)
nRBC: 0 % (ref 0.0–0.2)

## 2019-09-13 LAB — LIPASE, BLOOD: Lipase: 19 U/L (ref 11–51)

## 2019-09-13 MED ORDER — ONDANSETRON HCL 4 MG/2ML IJ SOLN
4.0000 mg | Freq: Once | INTRAMUSCULAR | Status: AC
  Administered 2019-09-13: 4 mg via INTRAVENOUS
  Filled 2019-09-13: qty 2

## 2019-09-13 MED ORDER — SODIUM CHLORIDE 0.9 % IV SOLN: INTRAVENOUS | Status: DC

## 2019-09-13 MED ORDER — SODIUM CHLORIDE (PF) 0.9 % IJ SOLN
INTRAMUSCULAR | Status: AC
  Filled 2019-09-13: qty 50

## 2019-09-13 MED ORDER — SODIUM CHLORIDE 0.9 % IV BOLUS
1000.0000 mL | Freq: Once | INTRAVENOUS | Status: AC
  Administered 2019-09-13: 1000 mL via INTRAVENOUS

## 2019-09-13 MED ORDER — IOHEXOL 300 MG/ML  SOLN
100.0000 mL | Freq: Once | INTRAMUSCULAR | Status: AC | PRN
  Administered 2019-09-13: 100 mL via INTRAVENOUS

## 2019-09-13 MED ORDER — HYDROMORPHONE HCL 1 MG/ML IJ SOLN
1.0000 mg | Freq: Once | INTRAMUSCULAR | Status: AC
  Administered 2019-09-13: 1 mg via INTRAVENOUS
  Filled 2019-09-13: qty 1

## 2019-09-13 MED ORDER — SODIUM CHLORIDE 0.9% FLUSH
3.0000 mL | Freq: Once | INTRAVENOUS | Status: AC
  Administered 2019-09-13: 3 mL via INTRAVENOUS

## 2019-09-13 NOTE — ED Notes (Signed)
Patient aware we need urine sample, stating he still doesn't feel like he can go right now.

## 2019-09-13 NOTE — Discharge Instructions (Signed)
Make an appointment to follow-up with general surgery.  Try to avoid fatty foods.  Return for pain lasting several hours and not resolving.  Or for persistent vomiting or fevers.  Work-up here today confirms gallstones in the gallbladder but no signs of the gallbladder being infected.  Work note provided.

## 2019-09-13 NOTE — ED Triage Notes (Signed)
Patient states he has been diagnosed with gallstones x 1 month. Patient states today he has had worsening pain, diarrhea, and nausea.
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
## 2019-10-08 DIAGNOSIS — Z9889 Other specified postprocedural states: Secondary | ICD-10-CM | POA: Diagnosis not present

## 2019-10-08 DIAGNOSIS — R1084 Generalized abdominal pain: Secondary | ICD-10-CM | POA: Diagnosis not present

## 2019-10-17 DIAGNOSIS — S3713XD Laceration of ureter, subsequent encounter: Secondary | ICD-10-CM | POA: Diagnosis not present

## 2019-10-17 DIAGNOSIS — R1084 Generalized abdominal pain: Secondary | ICD-10-CM | POA: Diagnosis not present

## 2019-10-31 DIAGNOSIS — R1011 Right upper quadrant pain: Secondary | ICD-10-CM | POA: Diagnosis not present

## 2019-10-31 DIAGNOSIS — Z6822 Body mass index (BMI) 22.0-22.9, adult: Secondary | ICD-10-CM | POA: Diagnosis not present

## 2019-10-31 DIAGNOSIS — K8020 Calculus of gallbladder without cholecystitis without obstruction: Secondary | ICD-10-CM | POA: Diagnosis not present

## 2019-10-31 DIAGNOSIS — H5203 Hypermetropia, bilateral: Secondary | ICD-10-CM | POA: Diagnosis not present

## 2019-11-25 DIAGNOSIS — S72351D Displaced comminuted fracture of shaft of right femur, subsequent encounter for closed fracture with routine healing: Secondary | ICD-10-CM | POA: Diagnosis not present

## 2019-11-26 ENCOUNTER — Ambulatory Visit: Payer: Self-pay | Admitting: Student

## 2019-11-26 DIAGNOSIS — S72351A Displaced comminuted fracture of shaft of right femur, initial encounter for closed fracture: Secondary | ICD-10-CM

## 2019-11-26 DIAGNOSIS — T8484XA Pain due to internal orthopedic prosthetic devices, implants and grafts, initial encounter: Secondary | ICD-10-CM

## 2019-12-02 DIAGNOSIS — K8010 Calculus of gallbladder with chronic cholecystitis without obstruction: Secondary | ICD-10-CM | POA: Diagnosis not present

## 2019-12-02 DIAGNOSIS — Z1159 Encounter for screening for other viral diseases: Secondary | ICD-10-CM | POA: Diagnosis not present

## 2019-12-02 DIAGNOSIS — Z20828 Contact with and (suspected) exposure to other viral communicable diseases: Secondary | ICD-10-CM | POA: Diagnosis not present

## 2019-12-06 DIAGNOSIS — K915 Postcholecystectomy syndrome: Secondary | ICD-10-CM | POA: Diagnosis not present

## 2019-12-06 DIAGNOSIS — F17210 Nicotine dependence, cigarettes, uncomplicated: Secondary | ICD-10-CM | POA: Diagnosis not present

## 2019-12-06 DIAGNOSIS — K8010 Calculus of gallbladder with chronic cholecystitis without obstruction: Secondary | ICD-10-CM | POA: Diagnosis not present

## 2019-12-20 ENCOUNTER — Ambulatory Visit: Admit: 2019-12-20 | Payer: BLUE CROSS/BLUE SHIELD | Admitting: Student

## 2019-12-20 SURGERY — REMOVAL, HARDWARE
Anesthesia: General | Laterality: Right

## 2019-12-24 DIAGNOSIS — J029 Acute pharyngitis, unspecified: Secondary | ICD-10-CM | POA: Diagnosis not present

## 2019-12-24 DIAGNOSIS — Z20828 Contact with and (suspected) exposure to other viral communicable diseases: Secondary | ICD-10-CM | POA: Diagnosis not present

## 2019-12-24 DIAGNOSIS — J0100 Acute maxillary sinusitis, unspecified: Secondary | ICD-10-CM | POA: Diagnosis not present

## 2019-12-24 DIAGNOSIS — R519 Headache, unspecified: Secondary | ICD-10-CM | POA: Diagnosis not present

## 2020-01-01 DIAGNOSIS — Z9049 Acquired absence of other specified parts of digestive tract: Secondary | ICD-10-CM | POA: Diagnosis not present

## 2020-01-01 DIAGNOSIS — N2882 Megaloureter: Secondary | ICD-10-CM | POA: Diagnosis not present

## 2020-01-01 DIAGNOSIS — N200 Calculus of kidney: Secondary | ICD-10-CM | POA: Diagnosis not present

## 2020-01-01 DIAGNOSIS — K660 Peritoneal adhesions (postprocedural) (postinfection): Secondary | ICD-10-CM | POA: Diagnosis not present

## 2020-01-01 DIAGNOSIS — R1012 Left upper quadrant pain: Secondary | ICD-10-CM | POA: Diagnosis not present

## 2020-01-01 DIAGNOSIS — R933 Abnormal findings on diagnostic imaging of other parts of digestive tract: Secondary | ICD-10-CM | POA: Diagnosis not present

## 2020-01-01 DIAGNOSIS — K6389 Other specified diseases of intestine: Secondary | ICD-10-CM | POA: Diagnosis not present

## 2020-01-28 DIAGNOSIS — Z6822 Body mass index (BMI) 22.0-22.9, adult: Secondary | ICD-10-CM | POA: Diagnosis not present

## 2020-01-28 DIAGNOSIS — R197 Diarrhea, unspecified: Secondary | ICD-10-CM | POA: Diagnosis not present

## 2020-02-24 DIAGNOSIS — Z932 Ileostomy status: Secondary | ICD-10-CM | POA: Diagnosis not present

## 2020-02-24 DIAGNOSIS — K529 Noninfective gastroenteritis and colitis, unspecified: Secondary | ICD-10-CM | POA: Diagnosis not present

## 2020-02-24 DIAGNOSIS — Z6822 Body mass index (BMI) 22.0-22.9, adult: Secondary | ICD-10-CM | POA: Diagnosis not present

## 2020-03-16 DIAGNOSIS — M791 Myalgia, unspecified site: Secondary | ICD-10-CM | POA: Diagnosis not present

## 2020-03-16 DIAGNOSIS — M545 Low back pain, unspecified: Secondary | ICD-10-CM | POA: Diagnosis not present

## 2020-03-16 DIAGNOSIS — S3992XA Unspecified injury of lower back, initial encounter: Secondary | ICD-10-CM | POA: Diagnosis not present

## 2020-03-16 DIAGNOSIS — R29898 Other symptoms and signs involving the musculoskeletal system: Secondary | ICD-10-CM | POA: Diagnosis not present

## 2020-03-19 DIAGNOSIS — S3992XA Unspecified injury of lower back, initial encounter: Secondary | ICD-10-CM | POA: Diagnosis not present

## 2020-03-19 DIAGNOSIS — R29898 Other symptoms and signs involving the musculoskeletal system: Secondary | ICD-10-CM | POA: Diagnosis not present

## 2020-03-19 DIAGNOSIS — M791 Myalgia, unspecified site: Secondary | ICD-10-CM | POA: Diagnosis not present

## 2020-03-19 DIAGNOSIS — M545 Low back pain, unspecified: Secondary | ICD-10-CM | POA: Diagnosis not present

## 2020-04-01 ENCOUNTER — Encounter: Payer: Self-pay | Admitting: Gastroenterology

## 2020-04-01 ENCOUNTER — Ambulatory Visit (INDEPENDENT_AMBULATORY_CARE_PROVIDER_SITE_OTHER): Payer: BLUE CROSS/BLUE SHIELD | Admitting: Gastroenterology

## 2020-04-01 VITALS — BP 100/60 | HR 96 | Ht 69.5 in | Wt 154.5 lb

## 2020-04-01 DIAGNOSIS — R634 Abnormal weight loss: Secondary | ICD-10-CM | POA: Diagnosis not present

## 2020-04-01 DIAGNOSIS — K529 Noninfective gastroenteritis and colitis, unspecified: Secondary | ICD-10-CM | POA: Insufficient documentation

## 2020-04-01 MED ORDER — CHOLESTYRAMINE 4 G PO PACK
4.0000 g | PACK | Freq: Two times a day (BID) | ORAL | 5 refills | Status: DC
Start: 2020-04-01 — End: 2020-05-26

## 2020-04-01 NOTE — Patient Instructions (Addendum)
If you are age 23 or older, your body mass index should be between 23-30. Your Body mass index is 22.49 kg/m. If this is out of the aforementioned range listed, please consider follow up with your Primary Care Provider.  If you are age 55 or younger, your body mass index should be between 19-25. Your Body mass index is 22.49 kg/m. If this is out of the aformentioned range listed, please consider follow up with your Primary Care Provider.   We have sent the following medications to your pharmacy for you to pick up at your convenience: Questran 1 packet twice daily.
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
## 2020-04-02 NOTE — Progress Notes (Signed)
I agree with the above note, plan 

## 2020-05-06 ENCOUNTER — Ambulatory Visit: Payer: Self-pay | Admitting: Gastroenterology

## 2020-05-19 ENCOUNTER — Ambulatory Visit: Payer: Self-pay | Admitting: Gastroenterology

## 2020-05-26 ENCOUNTER — Encounter: Payer: Self-pay | Admitting: Gastroenterology

## 2020-05-26 ENCOUNTER — Ambulatory Visit (INDEPENDENT_AMBULATORY_CARE_PROVIDER_SITE_OTHER): Payer: BC Managed Care – PPO | Admitting: Gastroenterology

## 2020-05-26 VITALS — BP 110/78 | HR 105 | Ht 69.5 in | Wt 157.4 lb

## 2020-05-26 DIAGNOSIS — K529 Noninfective gastroenteritis and colitis, unspecified: Secondary | ICD-10-CM

## 2020-05-26 MED ORDER — CHOLESTYRAMINE 4 G PO PACK: 4.0000 g | PACK | Freq: Three times a day (TID) | ORAL | 11 refills | Status: DC

## 2020-05-26 NOTE — Progress Notes (Signed)
I agree with the above note, plan 

## 2020-05-26 NOTE — Patient Instructions (Signed)
If you are age 24 or older, your body mass index should be between 23-30. Your Body mass index is 22.91 kg/m. If this is out of the aforementioned range listed, please consider follow up with your Primary Care Provider.  If you are age 48 or younger, your body mass index should be between 19-25. Your Body mass index is 22.91 kg/m. If this is out of the aformentioned range listed, please consider follow up with your Primary Care Provider.   We have sent the following medications to your pharmacy for you to pick up at your convenience: Questran 3 packets daily.   Thank you for choosing me and [facility].  Doug Sou, PA-C
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
## 2021-02-08 DIAGNOSIS — M79604 Pain in right leg: Secondary | ICD-10-CM | POA: Diagnosis not present

## 2021-02-08 DIAGNOSIS — M545 Low back pain, unspecified: Secondary | ICD-10-CM | POA: Diagnosis not present

## 2021-02-08 DIAGNOSIS — R29898 Other symptoms and signs involving the musculoskeletal system: Secondary | ICD-10-CM | POA: Diagnosis not present

## 2021-02-08 DIAGNOSIS — T1490XA Injury, unspecified, initial encounter: Secondary | ICD-10-CM | POA: Diagnosis not present

## 2021-03-11 DIAGNOSIS — R29898 Other symptoms and signs involving the musculoskeletal system: Secondary | ICD-10-CM | POA: Diagnosis not present

## 2021-03-11 DIAGNOSIS — M79604 Pain in right leg: Secondary | ICD-10-CM | POA: Diagnosis not present

## 2021-03-11 DIAGNOSIS — T1490XA Injury, unspecified, initial encounter: Secondary | ICD-10-CM | POA: Diagnosis not present

## 2021-03-11 DIAGNOSIS — M545 Low back pain, unspecified: Secondary | ICD-10-CM | POA: Diagnosis not present

## 2021-03-23 DIAGNOSIS — R262 Difficulty in walking, not elsewhere classified: Secondary | ICD-10-CM | POA: Diagnosis not present

## 2021-03-23 DIAGNOSIS — M2560 Stiffness of unspecified joint, not elsewhere classified: Secondary | ICD-10-CM | POA: Diagnosis not present

## 2021-03-23 DIAGNOSIS — M5416 Radiculopathy, lumbar region: Secondary | ICD-10-CM | POA: Diagnosis not present

## 2021-03-23 DIAGNOSIS — R531 Weakness: Secondary | ICD-10-CM | POA: Diagnosis not present

## 2021-04-01 DIAGNOSIS — R531 Weakness: Secondary | ICD-10-CM | POA: Diagnosis not present

## 2021-04-01 DIAGNOSIS — M5416 Radiculopathy, lumbar region: Secondary | ICD-10-CM | POA: Diagnosis not present

## 2021-04-01 DIAGNOSIS — M2560 Stiffness of unspecified joint, not elsewhere classified: Secondary | ICD-10-CM | POA: Diagnosis not present

## 2021-04-01 DIAGNOSIS — R262 Difficulty in walking, not elsewhere classified: Secondary | ICD-10-CM | POA: Diagnosis not present

## 2021-04-07 DIAGNOSIS — R262 Difficulty in walking, not elsewhere classified: Secondary | ICD-10-CM | POA: Diagnosis not present

## 2021-04-07 DIAGNOSIS — M5416 Radiculopathy, lumbar region: Secondary | ICD-10-CM | POA: Diagnosis not present

## 2021-04-07 DIAGNOSIS — M2560 Stiffness of unspecified joint, not elsewhere classified: Secondary | ICD-10-CM | POA: Diagnosis not present

## 2021-04-07 DIAGNOSIS — R531 Weakness: Secondary | ICD-10-CM | POA: Diagnosis not present

## 2021-04-12 DIAGNOSIS — R531 Weakness: Secondary | ICD-10-CM | POA: Diagnosis not present

## 2021-04-12 DIAGNOSIS — M2560 Stiffness of unspecified joint, not elsewhere classified: Secondary | ICD-10-CM | POA: Diagnosis not present

## 2021-04-12 DIAGNOSIS — M5416 Radiculopathy, lumbar region: Secondary | ICD-10-CM | POA: Diagnosis not present

## 2021-04-12 DIAGNOSIS — R262 Difficulty in walking, not elsewhere classified: Secondary | ICD-10-CM | POA: Diagnosis not present

## 2021-04-15 DIAGNOSIS — M5416 Radiculopathy, lumbar region: Secondary | ICD-10-CM | POA: Diagnosis not present

## 2021-04-15 DIAGNOSIS — M2560 Stiffness of unspecified joint, not elsewhere classified: Secondary | ICD-10-CM | POA: Diagnosis not present

## 2021-04-15 DIAGNOSIS — R262 Difficulty in walking, not elsewhere classified: Secondary | ICD-10-CM | POA: Diagnosis not present

## 2021-04-15 DIAGNOSIS — R531 Weakness: Secondary | ICD-10-CM | POA: Diagnosis not present

## 2021-04-16 DIAGNOSIS — R29898 Other symptoms and signs involving the musculoskeletal system: Secondary | ICD-10-CM | POA: Diagnosis not present

## 2021-04-16 DIAGNOSIS — M79604 Pain in right leg: Secondary | ICD-10-CM | POA: Diagnosis not present

## 2021-04-16 DIAGNOSIS — M545 Low back pain, unspecified: Secondary | ICD-10-CM | POA: Diagnosis not present

## 2021-04-16 DIAGNOSIS — T1490XA Injury, unspecified, initial encounter: Secondary | ICD-10-CM | POA: Diagnosis not present

## 2021-11-20 DIAGNOSIS — K529 Noninfective gastroenteritis and colitis, unspecified: Secondary | ICD-10-CM | POA: Diagnosis not present

## 2021-11-22 DIAGNOSIS — R1084 Generalized abdominal pain: Secondary | ICD-10-CM | POA: Diagnosis not present

## 2021-11-22 DIAGNOSIS — M79651 Pain in right thigh: Secondary | ICD-10-CM | POA: Diagnosis not present

## 2021-11-22 DIAGNOSIS — R1013 Epigastric pain: Secondary | ICD-10-CM | POA: Diagnosis not present

## 2021-11-22 DIAGNOSIS — M25561 Pain in right knee: Secondary | ICD-10-CM | POA: Diagnosis not present

## 2021-11-22 DIAGNOSIS — M79641 Pain in right hand: Secondary | ICD-10-CM | POA: Diagnosis not present

## 2021-11-25 ENCOUNTER — Encounter: Payer: Self-pay | Admitting: Specialist

## 2022-01-14 DIAGNOSIS — M545 Low back pain, unspecified: Secondary | ICD-10-CM | POA: Diagnosis not present

## 2022-01-14 DIAGNOSIS — M79604 Pain in right leg: Secondary | ICD-10-CM | POA: Diagnosis not present

## 2022-01-14 DIAGNOSIS — E782 Mixed hyperlipidemia: Secondary | ICD-10-CM | POA: Diagnosis not present

## 2022-01-14 DIAGNOSIS — G43011 Migraine without aura, intractable, with status migrainosus: Secondary | ICD-10-CM | POA: Diagnosis not present

## 2022-01-14 DIAGNOSIS — E785 Hyperlipidemia, unspecified: Secondary | ICD-10-CM | POA: Diagnosis not present

## 2022-01-14 DIAGNOSIS — M79641 Pain in right hand: Secondary | ICD-10-CM | POA: Diagnosis not present

## 2022-03-10 DIAGNOSIS — S72301S Unspecified fracture of shaft of right femur, sequela: Secondary | ICD-10-CM | POA: Diagnosis not present

## 2022-03-10 DIAGNOSIS — Z969 Presence of functional implant, unspecified: Secondary | ICD-10-CM | POA: Diagnosis not present

## 2022-03-10 DIAGNOSIS — M79651 Pain in right thigh: Secondary | ICD-10-CM | POA: Diagnosis not present

## 2022-05-07 DIAGNOSIS — K56609 Unspecified intestinal obstruction, unspecified as to partial versus complete obstruction: Secondary | ICD-10-CM | POA: Diagnosis not present

## 2022-05-07 DIAGNOSIS — R1084 Generalized abdominal pain: Secondary | ICD-10-CM | POA: Diagnosis not present

## 2022-05-07 DIAGNOSIS — R1110 Vomiting, unspecified: Secondary | ICD-10-CM | POA: Diagnosis not present

## 2022-05-07 DIAGNOSIS — N200 Calculus of kidney: Secondary | ICD-10-CM | POA: Diagnosis not present

## 2022-05-07 DIAGNOSIS — Z87828 Personal history of other (healed) physical injury and trauma: Secondary | ICD-10-CM | POA: Diagnosis not present

## 2022-05-07 DIAGNOSIS — Z9889 Other specified postprocedural states: Secondary | ICD-10-CM | POA: Diagnosis not present

## 2022-05-07 DIAGNOSIS — R112 Nausea with vomiting, unspecified: Secondary | ICD-10-CM | POA: Diagnosis not present

## 2022-05-07 DIAGNOSIS — Z9049 Acquired absence of other specified parts of digestive tract: Secondary | ICD-10-CM | POA: Diagnosis not present

## 2022-05-07 DIAGNOSIS — K6389 Other specified diseases of intestine: Secondary | ICD-10-CM | POA: Diagnosis not present

## 2022-05-07 DIAGNOSIS — E876 Hypokalemia: Secondary | ICD-10-CM | POA: Diagnosis not present

## 2022-05-08 DIAGNOSIS — R1084 Generalized abdominal pain: Secondary | ICD-10-CM | POA: Diagnosis not present

## 2022-05-08 DIAGNOSIS — K56609 Unspecified intestinal obstruction, unspecified as to partial versus complete obstruction: Secondary | ICD-10-CM | POA: Diagnosis not present

## 2022-05-08 DIAGNOSIS — R112 Nausea with vomiting, unspecified: Secondary | ICD-10-CM | POA: Diagnosis not present

## 2022-05-26 ENCOUNTER — Encounter: Payer: Self-pay | Admitting: Physician Assistant

## 2022-05-26 ENCOUNTER — Ambulatory Visit (INDEPENDENT_AMBULATORY_CARE_PROVIDER_SITE_OTHER): Payer: BC Managed Care – PPO | Admitting: Physician Assistant

## 2022-05-26 VITALS — BP 118/70 | HR 99 | Ht 69.5 in | Wt 167.0 lb

## 2022-05-26 DIAGNOSIS — K56699 Other intestinal obstruction unspecified as to partial versus complete obstruction: Secondary | ICD-10-CM | POA: Diagnosis not present

## 2022-05-26 DIAGNOSIS — Z8719 Personal history of other diseases of the digestive system: Secondary | ICD-10-CM

## 2022-05-26 DIAGNOSIS — K529 Noninfective gastroenteritis and colitis, unspecified: Secondary | ICD-10-CM

## 2022-05-26 MED ORDER — PLENVU 140 G PO SOLR: ORAL | 0 refills | Status: DC

## 2022-05-26 MED ORDER — COLESTIPOL HCL 1 G PO TABS: 1.0000 g | ORAL_TABLET | Freq: Two times a day (BID) | ORAL | 2 refills | Status: DC
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
## 2022-05-26 NOTE — Patient Instructions (Signed)
If you are age 26 or older, your body mass index should be between 23-30. Your Body mass index is 24.31 kg/m. If this is out of the aforementioned range listed, please consider follow up with your Primary Care Provider.  If you are age 79 or younger, your body mass index should be between 19-25. Your Body mass index is 24.31 kg/m. If this is out of the aformentioned range listed, please consider follow up with your Primary Care Provider.   You have been scheduled for a colonoscopy. Please follow written instructions given to you at your visit today.  Please pick up your prep supplies at the pharmacy within the next 1-3 days. If you use inhalers (even only as needed), please bring them with you on the day of your procedure.  We have sent the following medications to your pharmacy for you to pick up at your convenience: Colestipol 1 g twice daily.  The [place] [facility] providers would like to encourage you to use [facility] to communicate with providers for non-urgent requests or questions.  Due to long hold times on the telephone, sending your provider a message by [facility] may be a faster and more efficient way to get a response.  Please allow 48 business hours for a response.  Please remember that this is for non-urgent requests.   It was a pleasure to see you today!  Thank you for trusting me with your gastrointestinal care!    Ellouise Newer , PA-C

## 2022-05-26 NOTE — Progress Notes (Signed)
____________________________________________________________  Attending physician addendum:  Thank you for sending this case to me and for discussing it during clinic. I have reviewed the entire note and agree with the plan.  Wilfrid Lund, MD  ____________________________________________________________

## 2022-06-01 DIAGNOSIS — G894 Chronic pain syndrome: Secondary | ICD-10-CM | POA: Diagnosis not present

## 2022-06-01 DIAGNOSIS — Z79899 Other long term (current) drug therapy: Secondary | ICD-10-CM | POA: Diagnosis not present

## 2022-06-01 DIAGNOSIS — Z1389 Encounter for screening for other disorder: Secondary | ICD-10-CM | POA: Diagnosis not present

## 2022-06-08 ENCOUNTER — Encounter: Payer: Self-pay | Admitting: Gastroenterology

## 2022-06-16 ENCOUNTER — Ambulatory Visit (AMBULATORY_SURGERY_CENTER): Payer: BC Managed Care – PPO | Admitting: Gastroenterology

## 2022-06-16 ENCOUNTER — Encounter: Payer: Self-pay | Admitting: Gastroenterology

## 2022-06-16 VITALS — BP 94/45 | HR 66 | Temp 98.4°F | Resp 12 | Ht 69.0 in | Wt 167.0 lb

## 2022-06-16 DIAGNOSIS — Z8719 Personal history of other diseases of the digestive system: Secondary | ICD-10-CM

## 2022-06-16 DIAGNOSIS — R933 Abnormal findings on diagnostic imaging of other parts of digestive tract: Secondary | ICD-10-CM

## 2022-06-16 DIAGNOSIS — R197 Diarrhea, unspecified: Secondary | ICD-10-CM | POA: Diagnosis not present

## 2022-06-16 DIAGNOSIS — K633 Ulcer of intestine: Secondary | ICD-10-CM | POA: Diagnosis not present

## 2022-06-16 MED ORDER — SODIUM CHLORIDE 0.9 % IV SOLN: 500.0000 mL | Freq: Once | INTRAVENOUS | Status: DC
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
## 2022-06-16 NOTE — Patient Instructions (Signed)
-  Continue present medications  -return to office as needed   YOU HAD AN ENDOSCOPIC PROCEDURE TODAY AT [facility]:   Refer to the procedure report that was given to you for any specific questions about what was found during the examination.  If the procedure report does not answer your questions, please call your gastroenterologist to clarify.  If you requested that your care partner not be given the details of your procedure findings, then the procedure report has been included in a sealed envelope for you to review at your convenience later.  YOU SHOULD EXPECT: Some feelings of bloating in the abdomen. Passage of more gas than usual.  Walking can help get rid of the air that was put into your GI tract during the procedure and reduce the bloating. If you had a lower endoscopy (such as a colonoscopy or flexible sigmoidoscopy) you may notice spotting of blood in your stool or on the toilet paper. If you underwent a bowel prep for your procedure, you may not have a normal bowel movement for a few days.  Please Note:  You might notice some irritation and congestion in your nose or some drainage.  This is from the oxygen used during your procedure.  There is no need for concern and it should clear up in a day or so.  SYMPTOMS TO REPORT IMMEDIATELY:  Following lower endoscopy (colonoscopy or flexible sigmoidoscopy):  Excessive amounts of blood in the stool  Significant tenderness or worsening of abdominal pains  Swelling of the abdomen that is new, acute  Fever of 100F or higher  For urgent or emergent issues, a gastroenterologist can be reached at any hour by calling [phone]. Do not use MyChart messaging for urgent concerns.    DIET:  We do recommend a small meal at first, but then you may proceed to your regular diet.  Drink plenty of fluids but you should avoid alcoholic beverages for 24 hours.  ACTIVITY:  You should plan to take it easy for the rest of today and you  should NOT DRIVE or use heavy machinery until tomorrow (because of the sedation medicines used during the test).    FOLLOW UP: Our staff will call the number listed on your records the next business day following your procedure.  We will call around 7:15- 8:00 am to check on you and address any questions or concerns that you may have regarding the information given to you following your procedure. If we do not reach you, we will leave a message.     If any biopsies were taken you will be contacted by phone or by letter within the next 1-3 weeks.  Please call us at [phone] if you have not heard about the biopsies in 3 weeks.    SIGNATURES/CONFIDENTIALITY: You and/or your care partner have signed paperwork which will be entered into your electronic medical record.  These signatures attest to the fact that that the information above on your After Visit Summary has been reviewed and is understood.  Full responsibility of the confidentiality of this discharge information lies with you and/or your care-partner.

## 2022-06-16 NOTE — Progress Notes (Signed)
Report to PACU, RN, vss, BBS= Clear.  

## 2022-06-17 ENCOUNTER — Telehealth: Payer: Self-pay

## 2022-06-17 NOTE — Telephone Encounter (Signed)
  Follow up Call-     06/16/2022    3:05 PM  Call back number  Post procedure Call Back phone  # [phone]  Permission to leave phone message Yes     Patient questions:  Do you have a fever, pain , or abdominal swelling? No. Pain Score  0 *  Have you tolerated food without any problems? Yes.    Have you been able to return to your normal activities? Yes.    Do you have any questions about your discharge instructions: Diet   No. Medications  No. Follow up visit  No.  Do you have questions or concerns about your Care? No.  Actions: * If pain score is 4 or above: No action needed, pain <4.

## 2022-06-27 DIAGNOSIS — G894 Chronic pain syndrome: Secondary | ICD-10-CM | POA: Diagnosis not present

## 2022-06-27 DIAGNOSIS — M25551 Pain in right hip: Secondary | ICD-10-CM | POA: Diagnosis not present

## 2022-07-14 ENCOUNTER — Other Ambulatory Visit: Payer: Self-pay

## 2022-07-14 MED ORDER — COLESTIPOL HCL 1 G PO TABS: 1.0000 g | ORAL_TABLET | Freq: Two times a day (BID) | ORAL | 10 refills | Status: DC

## 2022-07-21 DIAGNOSIS — G894 Chronic pain syndrome: Secondary | ICD-10-CM | POA: Diagnosis not present

## 2022-07-21 DIAGNOSIS — M25551 Pain in right hip: Secondary | ICD-10-CM | POA: Diagnosis not present

## 2022-08-12 ENCOUNTER — Telehealth: Payer: Self-pay | Admitting: Physician Assistant

## 2022-08-12 MED ORDER — COLESTIPOL HCL 1 G PO TABS: 3.0000 g | ORAL_TABLET | Freq: Two times a day (BID) | ORAL | 5 refills | Status: DC

## 2022-08-12 NOTE — Telephone Encounter (Signed)
Patient stated he is doing 3g 2 times a day and script was sent in and he was told to call with any questions or concerns.

## 2022-08-12 NOTE — Telephone Encounter (Signed)
Richard [name] are you willing to change the signature on this patient's colestid?

## 2022-08-12 NOTE — Telephone Encounter (Signed)
LVM for patient to call back regarding his medication. Need to see if he is take 3g in the morning and in the evening or if he is doing something differently

## 2022-08-12 NOTE — Telephone Encounter (Signed)
Inbound call from patient requesting the quantity change for medication colestid due to him taking the medication 3 times in the morning and 3 times in the evening. Please advise. . Thank you

## 2022-08-18 DIAGNOSIS — M25551 Pain in right hip: Secondary | ICD-10-CM | POA: Diagnosis not present

## 2022-08-18 DIAGNOSIS — G894 Chronic pain syndrome: Secondary | ICD-10-CM | POA: Diagnosis not present

## 2022-09-16 DIAGNOSIS — M25551 Pain in right hip: Secondary | ICD-10-CM | POA: Diagnosis not present

## 2022-10-25 DIAGNOSIS — M25551 Pain in right hip: Secondary | ICD-10-CM | POA: Diagnosis not present

## 2023-04-04 ENCOUNTER — Telehealth: Payer: Self-pay | Admitting: Gastroenterology

## 2023-04-04 NOTE — Telephone Encounter (Signed)
Inbound call from patient, requesting refill colestipol sent to pharmacy on file.

## 2023-04-05 ENCOUNTER — Other Ambulatory Visit: Payer: Self-pay

## 2023-04-05 MED ORDER — COLESTIPOL HCL 1 G PO TABS: 3.0000 g | ORAL_TABLET | Freq: Two times a day (BID) | ORAL | 5 refills | Status: DC

## 2023-11-28 ENCOUNTER — Other Ambulatory Visit: Payer: Self-pay

## 2023-11-28 MED ORDER — COLESTIPOL HCL 1 G PO TABS: 3.0000 g | ORAL_TABLET | Freq: Two times a day (BID) | ORAL | 0 refills | Status: DC

## 2023-11-28 NOTE — Telephone Encounter (Signed)
 Colestipol  refilled and note attached to call for appointment.

## 2023-12-28 ENCOUNTER — Ambulatory Visit (HOSPITAL_BASED_OUTPATIENT_CLINIC_OR_DEPARTMENT_OTHER): Admission: EM | Admit: 2023-12-28 | Discharge: 2023-12-28 | Disposition: A | Discharge status: Home / Self Care

## 2023-12-28 ENCOUNTER — Other Ambulatory Visit (HOSPITAL_BASED_OUTPATIENT_CLINIC_OR_DEPARTMENT_OTHER): Payer: Self-pay

## 2023-12-28 ENCOUNTER — Encounter (HOSPITAL_BASED_OUTPATIENT_CLINIC_OR_DEPARTMENT_OTHER): Payer: Self-pay

## 2023-12-28 DIAGNOSIS — R051 Acute cough: Secondary | ICD-10-CM

## 2023-12-28 DIAGNOSIS — J0140 Acute pansinusitis, unspecified: Secondary | ICD-10-CM

## 2023-12-28 DIAGNOSIS — J029 Acute pharyngitis, unspecified: Secondary | ICD-10-CM

## 2023-12-28 LAB — POC SOFIA SARS ANTIGEN FIA: SARS Coronavirus 2 Ag: NEGATIVE

## 2023-12-28 LAB — POCT RAPID STREP A (OFFICE): Rapid Strep A Screen: NEGATIVE

## 2023-12-28 MED ORDER — DOXYCYCLINE HYCLATE 100 MG PO CAPS
100.0000 mg | ORAL_CAPSULE | Freq: Two times a day (BID) | ORAL | 0 refills | Status: AC
  Filled 2023-12-28: qty 20, 10d supply, fill #0

## 2023-12-28 MED ORDER — FLUTICASONE PROPIONATE 50 MCG/ACT NA SUSP
1.0000 | Freq: Two times a day (BID) | NASAL | 0 refills | Status: AC | PRN
  Filled 2023-12-28: qty 16, 30d supply, fill #0
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
## 2023-12-28 NOTE — ED Triage Notes (Signed)
 Pt c/o nasal congestion that started Monday and SOBr that started today. He has been feeling like the can't catch his breath. He is also having cough-dry, HA, and body aches. Pt denies facial pain, sore throat, and fever. Pt has used cough drop and nyquil with no relief.

## 2023-12-28 NOTE — Discharge Instructions (Addendum)
 Sinusitis with sore throat and cough: COVID and strep test are negative.  Get plenty of fluids.  Needs to treat sinusitis with doxycycline  100 mg twice daily for 10 days.  Fluticasone  nasal spray, 1 spray into each nostril once or twice daily for nasal congestion.  Encouraged sinus rinses twice daily.  Follow-up if symptoms do not improve, worsen or new symptoms occur.

## 2024-01-12 ENCOUNTER — Other Ambulatory Visit: Payer: Self-pay | Admitting: *Deleted

## 2024-01-12 MED ORDER — COLESTIPOL HCL 1 G PO TABS: 3.0000 g | ORAL_TABLET | Freq: Two times a day (BID) | ORAL | 0 refills | Status: DC

## 2024-02-12 ENCOUNTER — Other Ambulatory Visit: Payer: Self-pay | Admitting: *Deleted

## 2024-02-12 MED ORDER — COLESTIPOL HCL 1 G PO TABS: 3.0000 g | ORAL_TABLET | Freq: Two times a day (BID) | ORAL | 0 refills | Status: DC

## 2024-03-12 ENCOUNTER — Other Ambulatory Visit: Payer: Self-pay | Admitting: *Deleted

## 2024-03-12 MED ORDER — COLESTIPOL HCL 1 G PO TABS: 3.0000 g | ORAL_TABLET | Freq: Two times a day (BID) | ORAL | 0 refills | Status: AC
